# Patient Record
Sex: Female | Born: 1945 | Race: White | Hispanic: No | Marital: Married | Smoking: Never smoker
Health system: Southern US, Community
[De-identification: ages and names within clinical notes are randomized; demographics above are authoritative.]

## PROBLEM LIST (undated history)

## (undated) DIAGNOSIS — J302 Other seasonal allergic rhinitis: Secondary | ICD-10-CM

## (undated) DIAGNOSIS — C439 Malignant melanoma of skin, unspecified: Secondary | ICD-10-CM

## (undated) DIAGNOSIS — E785 Hyperlipidemia, unspecified: Secondary | ICD-10-CM

## (undated) DIAGNOSIS — I10 Essential (primary) hypertension: Secondary | ICD-10-CM

## (undated) HISTORY — DX: Essential (primary) hypertension: I10

## (undated) HISTORY — PX: TUBAL LIGATION: SHX77

## (undated) HISTORY — DX: Hyperlipidemia, unspecified: E78.5

## (undated) HISTORY — PX: GALLBLADDER SURGERY: SHX652

## (undated) HISTORY — PX: ABDOMINAL HYSTERECTOMY: SHX81

## (undated) HISTORY — PX: BREAST BIOPSY: SHX20

## (undated) HISTORY — PX: TONSILLECTOMY: SUR1361

## (undated) HISTORY — DX: Other seasonal allergic rhinitis: J30.2

## (undated) HISTORY — DX: Malignant melanoma of skin, unspecified: C43.9

---

## 2001-01-29 ENCOUNTER — Encounter: Payer: Self-pay | Admitting: Obstetrics and Gynecology

## 2001-01-29 ENCOUNTER — Encounter: Admission: RE | Admit: 2001-01-29 | Discharge: 2001-01-29 | Payer: Self-pay | Admitting: Obstetrics and Gynecology

## 2002-02-10 ENCOUNTER — Encounter: Payer: Self-pay | Admitting: Obstetrics and Gynecology

## 2002-02-10 ENCOUNTER — Encounter: Admission: RE | Admit: 2002-02-10 | Discharge: 2002-02-10 | Payer: Self-pay | Admitting: Obstetrics and Gynecology

## 2002-04-16 ENCOUNTER — Encounter: Payer: Self-pay | Admitting: Family Medicine

## 2002-04-16 ENCOUNTER — Ambulatory Visit (HOSPITAL_COMMUNITY): Admission: RE | Admit: 2002-04-16 | Discharge: 2002-04-16 | Payer: Self-pay | Admitting: Family Medicine

## 2003-02-17 ENCOUNTER — Encounter: Payer: Self-pay | Admitting: Obstetrics and Gynecology

## 2003-02-17 ENCOUNTER — Encounter: Admission: RE | Admit: 2003-02-17 | Discharge: 2003-02-17 | Payer: Self-pay | Admitting: Obstetrics and Gynecology

## 2003-04-15 ENCOUNTER — Other Ambulatory Visit: Admission: RE | Admit: 2003-04-15 | Discharge: 2003-04-15 | Payer: Self-pay | Admitting: Obstetrics and Gynecology

## 2004-02-22 ENCOUNTER — Encounter: Admission: RE | Admit: 2004-02-22 | Discharge: 2004-02-22 | Payer: Self-pay | Admitting: Obstetrics and Gynecology

## 2004-04-24 ENCOUNTER — Other Ambulatory Visit: Admission: RE | Admit: 2004-04-24 | Discharge: 2004-04-24 | Payer: Self-pay | Admitting: Obstetrics and Gynecology

## 2004-09-12 ENCOUNTER — Ambulatory Visit (HOSPITAL_COMMUNITY): Admission: RE | Admit: 2004-09-12 | Discharge: 2004-09-12 | Payer: Self-pay | Admitting: Gastroenterology

## 2005-02-15 ENCOUNTER — Ambulatory Visit (HOSPITAL_COMMUNITY): Admission: RE | Admit: 2005-02-15 | Discharge: 2005-02-15 | Payer: Self-pay

## 2005-03-06 ENCOUNTER — Encounter: Admission: RE | Admit: 2005-03-06 | Discharge: 2005-03-06 | Payer: Self-pay | Admitting: Obstetrics and Gynecology

## 2005-03-07 ENCOUNTER — Encounter: Admission: RE | Admit: 2005-03-07 | Discharge: 2005-03-07 | Payer: Self-pay

## 2005-03-08 ENCOUNTER — Encounter (INDEPENDENT_AMBULATORY_CARE_PROVIDER_SITE_OTHER): Payer: Self-pay | Admitting: *Deleted

## 2005-03-08 ENCOUNTER — Ambulatory Visit (HOSPITAL_COMMUNITY): Admission: RE | Admit: 2005-03-08 | Discharge: 2005-03-08 | Payer: Self-pay

## 2005-03-08 ENCOUNTER — Ambulatory Visit (HOSPITAL_BASED_OUTPATIENT_CLINIC_OR_DEPARTMENT_OTHER): Admission: RE | Admit: 2005-03-08 | Discharge: 2005-03-08 | Payer: Self-pay

## 2005-09-17 ENCOUNTER — Other Ambulatory Visit: Admission: RE | Admit: 2005-09-17 | Discharge: 2005-09-17 | Payer: Self-pay | Admitting: Obstetrics and Gynecology

## 2006-03-18 ENCOUNTER — Encounter: Admission: RE | Admit: 2006-03-18 | Discharge: 2006-03-18 | Payer: Self-pay | Admitting: Obstetrics and Gynecology

## 2007-03-25 ENCOUNTER — Encounter: Admission: RE | Admit: 2007-03-25 | Discharge: 2007-03-25 | Payer: Self-pay | Admitting: Obstetrics and Gynecology

## 2007-09-24 ENCOUNTER — Other Ambulatory Visit: Admission: RE | Admit: 2007-09-24 | Discharge: 2007-09-24 | Payer: Self-pay | Admitting: Obstetrics and Gynecology

## 2008-04-07 ENCOUNTER — Encounter: Admission: RE | Admit: 2008-04-07 | Discharge: 2008-04-07 | Payer: Self-pay | Admitting: Obstetrics and Gynecology

## 2008-04-13 ENCOUNTER — Encounter: Admission: RE | Admit: 2008-04-13 | Discharge: 2008-04-13 | Payer: Self-pay | Admitting: Obstetrics and Gynecology

## 2008-04-14 ENCOUNTER — Encounter: Admission: RE | Admit: 2008-04-14 | Discharge: 2008-04-14 | Payer: Self-pay | Admitting: Obstetrics and Gynecology

## 2008-04-14 ENCOUNTER — Encounter (INDEPENDENT_AMBULATORY_CARE_PROVIDER_SITE_OTHER): Payer: Self-pay | Admitting: Diagnostic Radiology

## 2008-10-12 ENCOUNTER — Encounter: Admission: RE | Admit: 2008-10-12 | Discharge: 2008-10-12 | Payer: Self-pay | Admitting: Obstetrics and Gynecology

## 2008-10-20 ENCOUNTER — Other Ambulatory Visit: Admission: RE | Admit: 2008-10-20 | Discharge: 2008-10-20 | Payer: Self-pay | Admitting: Obstetrics and Gynecology

## 2008-10-20 ENCOUNTER — Ambulatory Visit: Payer: Self-pay | Admitting: Obstetrics and Gynecology

## 2008-10-20 ENCOUNTER — Encounter: Payer: Self-pay | Admitting: Obstetrics and Gynecology

## 2008-10-21 ENCOUNTER — Ambulatory Visit: Payer: Self-pay | Admitting: Obstetrics and Gynecology

## 2009-04-12 ENCOUNTER — Encounter: Admission: RE | Admit: 2009-04-12 | Discharge: 2009-04-12 | Payer: Self-pay | Admitting: Obstetrics and Gynecology

## 2009-10-12 ENCOUNTER — Encounter: Admission: RE | Admit: 2009-10-12 | Discharge: 2009-10-12 | Payer: Self-pay | Admitting: Gastroenterology

## 2009-10-13 ENCOUNTER — Encounter: Admission: RE | Admit: 2009-10-13 | Discharge: 2009-10-13 | Payer: Self-pay | Admitting: Gastroenterology

## 2010-04-19 ENCOUNTER — Encounter: Admission: RE | Admit: 2010-04-19 | Discharge: 2010-04-19 | Payer: Self-pay | Admitting: Obstetrics and Gynecology

## 2011-01-11 NOTE — Op Note (Signed)
NAME:  Ann Cohen, Ann Cohen NO.:  [id number]   MEDICAL RECORD NO.:  [id number]          PATIENT TYPE:  AMB   LOCATION:  DSC                          FACILITY:  [facility]   PHYSICIAN:  Lorre Munroe., M.D.DATE OF BIRTH:  08-27-1945   DATE OF PROCEDURE:  03/08/2005  DATE OF DISCHARGE:                                 OPERATIVE REPORT   PREOPERATIVE DIAGNOSIS:  Malignant melanoma of the back.   POSTOPERATIVE DIAGNOSIS:  Malignant melanoma of the back.   OPERATION:  Wide local excision of melanoma of the back.   SURGEON:  Lebron Conners, M.D.   ANESTHESIA:  General and local.   ESTIMATED BLOOD LOSS:  Minimal.   COMPLICATIONS:  No complications.   PROCEDURE IN DETAIL:  After the patient was monitored and anesthetized and  had routine preparation and draping of the skin of the back at the site of  previously excised melanoma, I marked an elliptical incision going 2 cm on  each side of the lesion at a total length of 12 cm. I made the incision  through the skin at the mark and got hemostasis with cautery. In the area of  the tumor, I dissected straight down through the subcutaneous tissues to the  level of the muscle fascia of the back. I did not excise the fascia. I did  lift the subcutaneous tissues which were quite thick in the region right off  the muscle. I took some subcutaneous tissue from the entire length of the  incision. I marked the specimen with a suture for orientation for the  pathologist's examination. I then undermined the subcutaneous tissues and  the deep plane just superficial to the muscle for several centimeters on  each side. I closed the subcutaneous tissues together with 3-0 Vicryl suture  and that brought the skin much closer together. I mobilized the skin  slightly in the central part of my incision and then I closed the skin with  three sutures of 2-0 nylon putting it  in a mattress fashion, and a running 3-0 nylon suture. I felt I had  excellent closure and an excellent incision and that there was no tension on  the skin line. Sponge, needle, and instrument counts were correct. I  anesthetized the operative area with long-acting local anesthetic. The  patient tolerated the operation well.       WB/MEDQ  D:  03/08/2005  T:  03/08/2005  Job:  [id number]   cc:   Caryn Bee L. Little, M.D.  [address]  [place]  [place] [address]  Fax: [phone]   Elinor Parkinson. Worthy Rancher, M.D.  [address] [place]  [place] [address]  Fax: [phone]

## 2011-01-11 NOTE — Op Note (Signed)
NAME:  Ann Cohen, SCHRODT NO.:  [id number]   MEDICAL RECORD NO.:  [id number]          PATIENT TYPE:  AMB   LOCATION:  ENDO                         FACILITY:  [facility]   PHYSICIAN:  Graylin Shiver, M.D.   DATE OF BIRTH:  06-08-1946   DATE OF PROCEDURE:  09/12/2004  DATE OF DISCHARGE:                                 OPERATIVE REPORT   INDICATIONS:  Screening.   Informed consent was obtained after explanation of risks of bleeding,  infection and perforation.   PREMEDICATION:  Fentanyl 75 mcg IV, Versed 7.5 mg IV.   PROCEDURE:  With the patient in the left lateral decubitus position, a  rectal exam was performed. No masses were felt. The Olympus colonoscope was  inserted into the rectum and advanced around the colon to the cecum. Cecal  landmarks were identified. The cecum and ascending colon were normal. The  transverse colon normal. The descending colon, sigmoid and rectum were  normal. She tolerated the procedure well without complications.   IMPRESSION:  Normal colonoscopy to cecum.       SFG/MEDQ  D:  09/12/2004  T:  09/12/2004  Job:  [id number]   cc:   Caryn Bee L. Little, M.D.  [address]  [place]  [place] [address]  Fax: [phone]

## 2011-03-13 ENCOUNTER — Other Ambulatory Visit: Payer: Self-pay | Admitting: Obstetrics and Gynecology

## 2011-03-13 DIAGNOSIS — Z1231 Encounter for screening mammogram for malignant neoplasm of breast: Secondary | ICD-10-CM

## 2011-04-25 ENCOUNTER — Ambulatory Visit
Admission: RE | Admit: 2011-04-25 | Discharge: 2011-04-25 | Disposition: A | Discharge status: Home / Self Care | Payer: Medicare Other | Source: Ambulatory Visit | Attending: Obstetrics and Gynecology | Admitting: Obstetrics and Gynecology

## 2011-04-25 DIAGNOSIS — Z1231 Encounter for screening mammogram for malignant neoplasm of breast: Secondary | ICD-10-CM

## 2011-09-05 DIAGNOSIS — H40059 Ocular hypertension, unspecified eye: Secondary | ICD-10-CM | POA: Diagnosis not present

## 2011-10-23 DIAGNOSIS — J019 Acute sinusitis, unspecified: Secondary | ICD-10-CM | POA: Diagnosis not present

## 2011-12-18 DIAGNOSIS — M79609 Pain in unspecified limb: Secondary | ICD-10-CM | POA: Diagnosis not present

## 2011-12-19 ENCOUNTER — Other Ambulatory Visit: Payer: Self-pay | Admitting: Family Medicine

## 2011-12-19 ENCOUNTER — Ambulatory Visit
Admission: RE | Admit: 2011-12-19 | Discharge: 2011-12-19 | Disposition: A | Discharge status: Home / Self Care | Payer: Medicare Other | Source: Ambulatory Visit | Attending: Family Medicine | Admitting: Family Medicine

## 2011-12-19 DIAGNOSIS — M7989 Other specified soft tissue disorders: Secondary | ICD-10-CM

## 2011-12-19 DIAGNOSIS — M79669 Pain in unspecified lower leg: Secondary | ICD-10-CM

## 2012-02-20 DIAGNOSIS — D235 Other benign neoplasm of skin of trunk: Secondary | ICD-10-CM | POA: Diagnosis not present

## 2012-02-20 DIAGNOSIS — Z85820 Personal history of malignant melanoma of skin: Secondary | ICD-10-CM | POA: Diagnosis not present

## 2012-02-20 DIAGNOSIS — H61009 Unspecified perichondritis of external ear, unspecified ear: Secondary | ICD-10-CM | POA: Diagnosis not present

## 2012-04-29 DIAGNOSIS — M25579 Pain in unspecified ankle and joints of unspecified foot: Secondary | ICD-10-CM | POA: Diagnosis not present

## 2012-05-11 ENCOUNTER — Other Ambulatory Visit: Payer: Self-pay | Admitting: Obstetrics and Gynecology

## 2012-05-11 DIAGNOSIS — Z1231 Encounter for screening mammogram for malignant neoplasm of breast: Secondary | ICD-10-CM

## 2012-05-25 ENCOUNTER — Ambulatory Visit
Admission: RE | Admit: 2012-05-25 | Discharge: 2012-05-25 | Disposition: A | Discharge status: Home / Self Care | Payer: Managed Care, Other (non HMO) | Source: Ambulatory Visit | Attending: Obstetrics and Gynecology | Admitting: Obstetrics and Gynecology

## 2012-05-25 DIAGNOSIS — Z1231 Encounter for screening mammogram for malignant neoplasm of breast: Secondary | ICD-10-CM | POA: Diagnosis not present

## 2012-10-15 DIAGNOSIS — R05 Cough: Secondary | ICD-10-CM | POA: Diagnosis not present

## 2012-10-15 DIAGNOSIS — R059 Cough, unspecified: Secondary | ICD-10-CM | POA: Diagnosis not present

## 2012-10-15 DIAGNOSIS — I10 Essential (primary) hypertension: Secondary | ICD-10-CM | POA: Diagnosis not present

## 2012-10-15 DIAGNOSIS — R7301 Impaired fasting glucose: Secondary | ICD-10-CM | POA: Diagnosis not present

## 2012-10-15 DIAGNOSIS — Z79899 Other long term (current) drug therapy: Secondary | ICD-10-CM | POA: Diagnosis not present

## 2012-10-15 DIAGNOSIS — Z1331 Encounter for screening for depression: Secondary | ICD-10-CM | POA: Diagnosis not present

## 2012-10-15 DIAGNOSIS — E7800 Pure hypercholesterolemia, unspecified: Secondary | ICD-10-CM | POA: Diagnosis not present

## 2012-10-15 DIAGNOSIS — R42 Dizziness and giddiness: Secondary | ICD-10-CM | POA: Diagnosis not present

## 2012-10-15 DIAGNOSIS — R0789 Other chest pain: Secondary | ICD-10-CM | POA: Diagnosis not present

## 2012-10-15 LAB — PULMONARY FUNCTION TEST

## 2012-11-02 DIAGNOSIS — R0602 Shortness of breath: Secondary | ICD-10-CM | POA: Diagnosis not present

## 2012-11-02 DIAGNOSIS — I10 Essential (primary) hypertension: Secondary | ICD-10-CM | POA: Diagnosis not present

## 2012-11-02 DIAGNOSIS — R011 Cardiac murmur, unspecified: Secondary | ICD-10-CM | POA: Diagnosis not present

## 2012-11-02 DIAGNOSIS — R079 Chest pain, unspecified: Secondary | ICD-10-CM | POA: Diagnosis not present

## 2012-11-11 DIAGNOSIS — R0602 Shortness of breath: Secondary | ICD-10-CM | POA: Diagnosis not present

## 2012-11-11 DIAGNOSIS — R079 Chest pain, unspecified: Secondary | ICD-10-CM | POA: Diagnosis not present

## 2012-11-11 DIAGNOSIS — I10 Essential (primary) hypertension: Secondary | ICD-10-CM | POA: Diagnosis not present

## 2012-12-09 ENCOUNTER — Encounter: Payer: Self-pay | Admitting: Pulmonary Disease

## 2012-12-09 ENCOUNTER — Ambulatory Visit (INDEPENDENT_AMBULATORY_CARE_PROVIDER_SITE_OTHER): Payer: Medicare Other | Admitting: Pulmonary Disease

## 2012-12-09 VITALS — BP 136/80 | HR 69 | Temp 98.3°F | Ht 60.0 in | Wt 176.2 lb

## 2012-12-09 DIAGNOSIS — R059 Cough, unspecified: Secondary | ICD-10-CM | POA: Diagnosis not present

## 2012-12-09 DIAGNOSIS — R05 Cough: Secondary | ICD-10-CM

## 2012-12-09 DIAGNOSIS — R053 Chronic cough: Secondary | ICD-10-CM | POA: Insufficient documentation

## 2012-12-09 MED ORDER — FLUTICASONE PROPIONATE 50 MCG/ACT NA SUSP: 2.0000 | Freq: Every day | NASAL | Status: DC

## 2012-12-09 MED ORDER — TRAMADOL HCL 50 MG PO TABS: 50.0000 mg | ORAL_TABLET | Freq: Four times a day (QID) | ORAL | Status: DC | PRN

## 2012-12-09 NOTE — Progress Notes (Signed)
  Subjective:    Patient ID: Ann Cohen, female    DOB: July 18, 1946, 67 y.o.   MRN: [id number]  HPI The patient is a 67 year old female who I've been asked to see for chronic cough.  The patient states that she has had a cough for 3 years duration, and it typically is intermittent.  When it does occur, it stays for quite a long time.  Her cough is dry and hacking in nature, and she denies any chest congestion.  She is unsure if she has persistent throat clearing, but she is doing this quite a bit during the visit.  She feels the stimulus for her cough is coming from her left upper lungs.  She knows of nothing which makes her cough better, and feels that it is worse during allergy season.  The cough is also worse with prolonged conversation.  The patient has a history of peptic ulcer disease, and has ongoing symptoms of reflux.  She is currently on a proton pump inhibitor in bearing doses, as well as Pepcid.  She denies any postnasal drip, nasal congestion, or recurrent sinus infections.  However, she stays on her allergy medications regularly.  She feels her exertional tolerance is worse than one to 2 years ago, and also states that her weight has been neutral.  She has no history of asthma, and has never smoked.  She has had a chest x-ray and spirometry recently, but that data is not available at this time.   Review of Systems  Constitutional: Negative for fever and unexpected weight change.  HENT: Negative for ear pain, nosebleeds, congestion, sore throat, rhinorrhea, sneezing, trouble swallowing, dental problem, postnasal drip and sinus pressure.   Eyes: Negative for redness and itching.  Respiratory: Positive for cough and shortness of breath. Negative for chest tightness and wheezing.   Cardiovascular: Negative for palpitations and leg swelling.  Gastrointestinal: Negative for nausea and vomiting.  Genitourinary: Negative for dysuria.  Musculoskeletal: Negative for joint swelling.  Skin:  Negative for rash.  Neurological: Negative for headaches.  Hematological: Does not bruise/bleed easily.  Psychiatric/Behavioral: Negative for dysphoric mood. The patient is not nervous/anxious.        Objective:   Physical Exam Constitutional:  Well developed, no acute distress  HENT:  Nares patent without discharge, mild inflammatory changes in nasal mucosa  Oropharynx without exudate, palate and uvula are normal  Eyes:  Perrla, eomi, no scleral icterus  Neck:  No JVD, no TMG  Cardiovascular:  Normal rate, regular rhythm, no rubs or gallops.  No murmurs        Intact distal pulses  Pulmonary :  Normal breath sounds, no stridor or respiratory distress   No rales, rhonchi, or wheezing  Abdominal:  Soft, nondistended, bowel sounds present.  No tenderness noted.   Musculoskeletal:  No lower extremity edema noted.  Lymph Nodes:  No cervical lymphadenopathy noted  Skin:  No cyanosis noted  Neurologic:  Alert, appropriate, moves all 4 extremities without obvious deficit.         Assessment & Plan:

## 2012-12-09 NOTE — Patient Instructions (Addendum)
Take your nexium one in am and pm everyday, and take pepcid at bedtime Start on your nasal spray, 2 each nostril each am for next 2 weeks. Stop allegra, and start chlorpheniramine 4mg .  Take 2 at bedtime and one at lunch for next 2 weeks. No throat clearing, minimize voice use.  Keep hard candy (no menthol or mint) in your mouth throughout the day to help prevent cough Can take tramadol 50mg  one every 6hrs if needed for cough.  Will get cxr report and your breathing tests from Dr. Clarene Duke to review. followup with me in 2 weeks.

## 2012-12-09 NOTE — Addendum Note (Signed)
Addended by: Nita Sells on: 12/09/2012 04:38 PM   Modules accepted: Orders

## 2012-12-09 NOTE — Assessment & Plan Note (Signed)
The patient has a chronic cough of 3 years duration that I suspect his upper airway in origin.  She has had spirometry and chest x-ray better not available, and we'll certainly review these.  She has a long history of reflux, and feels that she has breakthrough symptoms despite taking acid suppression medications.  I suspect she also has a component of cyclical coughing.  Her lungs are totally clear today, and I suspect this is not related to a pulmonary process.  However, we must keep in mind cough variant asthma, as well as eosinophilic bronchitis.  Will work on aggressive treatment for postnasal drip, reflux disease, and also cyclical coughing, and see how she does.

## 2012-12-10 ENCOUNTER — Telehealth: Payer: Self-pay | Admitting: Pulmonary Disease

## 2012-12-10 NOTE — Telephone Encounter (Signed)
One thing is to take on a full stomach. She could try a half and see if that works for her.  If still not tolerating, will have to discontinue and keep doing other recommendations.

## 2012-12-10 NOTE — Telephone Encounter (Signed)
I spoke with pt. She stated she took the tramadol yesterday for the 1st time. She states she was not able to sleep "a wink last night" and feels sick to ehr stomach this morning. She wants to know if she cuts in half if [facility] thinks she will have the same affect or what are other recs.Please advise thanks  Allergies  Allergen Reactions  . Codeine   . Sulfur

## 2012-12-10 NOTE — Telephone Encounter (Signed)
Pt is aware of [facility] recommendations.

## 2012-12-18 ENCOUNTER — Institutional Professional Consult (permissible substitution): Payer: Medicare Other | Admitting: Pulmonary Disease

## 2012-12-23 ENCOUNTER — Encounter: Payer: Self-pay | Admitting: Pulmonary Disease

## 2012-12-23 ENCOUNTER — Ambulatory Visit (INDEPENDENT_AMBULATORY_CARE_PROVIDER_SITE_OTHER): Payer: Medicare Other | Admitting: Pulmonary Disease

## 2012-12-23 VITALS — BP 152/94 | HR 74 | Temp 97.9°F | Ht 60.0 in | Wt 175.0 lb

## 2012-12-23 DIAGNOSIS — R053 Chronic cough: Secondary | ICD-10-CM

## 2012-12-23 DIAGNOSIS — R05 Cough: Secondary | ICD-10-CM | POA: Diagnosis not present

## 2012-12-23 DIAGNOSIS — R059 Cough, unspecified: Secondary | ICD-10-CM | POA: Diagnosis not present

## 2012-12-23 NOTE — Progress Notes (Signed)
  Subjective:    Patient ID: Ann Cohen, female    DOB: [birth year], 67 y.o.   MRN: [id number]  HPI The patient comes in today for followup of her chronic cough.  At last visit, she was treated more aggressively for postnasal drip, reflux disease, and cyclical coughing.  She returns today where her cough is rated a one, and it was a 10 at last visit.  She has had a very good response in a short period of time, and is pleased with her progress.  She is unsure what has helped her the most regarding resolution.   Review of Systems  Constitutional: Negative for fever and unexpected weight change.  HENT: Negative for ear pain, nosebleeds, congestion, sore throat, rhinorrhea, sneezing, trouble swallowing, dental problem, postnasal drip and sinus pressure.   Eyes: Negative for redness and itching.  Respiratory: Negative for cough, chest tightness, shortness of breath and wheezing.   Cardiovascular: Negative for palpitations and leg swelling.  Gastrointestinal: Negative for nausea and vomiting.  Genitourinary: Negative for dysuria.  Musculoskeletal: Negative for joint swelling.  Skin: Negative for rash.  Neurological: Negative for headaches.  Hematological: Does not bruise/bleed easily.  Psychiatric/Behavioral: Negative for dysphoric mood. The patient is not nervous/anxious.        Objective:   Physical Exam Obese female in no acute distress Nose without purulence or discharge noted Neck without lymphadenopathy or thyromegaly Lower extremities without edema, no cyanosis Alert and oriented, moves all 4 extremities.       Assessment & Plan:

## 2012-12-23 NOTE — Assessment & Plan Note (Signed)
The patient is much improved with aggressive treatment of postnasal drip, reflux disease, and cyclical coughing.  I would like to back off on some of the medications to more reasonable doses, and see if she maintains.  I suspect reflux is more of an issue here than the patient suspects.  If her cough begins to return, I would increase her proton pump inhibitor back to b.i.d. Dosing.

## 2012-12-23 NOTE — Patient Instructions (Addendum)
Stop pepcid at bedtime and decrease nexium back to each am only.  If your cough begins to return, increase your nexium back to am and pm. Stay on flonase, but change your chlorpheniramine to as needed (rather than everyday).  Continue with hard candy as needed, no throat clearing, limiting voice use. Please call if you cough is returning.

## 2013-02-11 DIAGNOSIS — E7800 Pure hypercholesterolemia, unspecified: Secondary | ICD-10-CM | POA: Diagnosis not present

## 2013-03-09 DIAGNOSIS — D235 Other benign neoplasm of skin of trunk: Secondary | ICD-10-CM | POA: Diagnosis not present

## 2013-03-09 DIAGNOSIS — Z85820 Personal history of malignant melanoma of skin: Secondary | ICD-10-CM | POA: Diagnosis not present

## 2013-03-09 DIAGNOSIS — L570 Actinic keratosis: Secondary | ICD-10-CM | POA: Diagnosis not present

## 2013-05-03 ENCOUNTER — Other Ambulatory Visit: Payer: Self-pay

## 2013-05-03 DIAGNOSIS — Z1231 Encounter for screening mammogram for malignant neoplasm of breast: Secondary | ICD-10-CM

## 2013-05-10 DIAGNOSIS — M545 Low back pain, unspecified: Secondary | ICD-10-CM | POA: Diagnosis not present

## 2013-05-21 DIAGNOSIS — R7301 Impaired fasting glucose: Secondary | ICD-10-CM | POA: Diagnosis not present

## 2013-05-21 DIAGNOSIS — R0789 Other chest pain: Secondary | ICD-10-CM | POA: Diagnosis not present

## 2013-05-21 DIAGNOSIS — Z79899 Other long term (current) drug therapy: Secondary | ICD-10-CM | POA: Diagnosis not present

## 2013-05-21 DIAGNOSIS — M25569 Pain in unspecified knee: Secondary | ICD-10-CM | POA: Diagnosis not present

## 2013-05-21 DIAGNOSIS — R42 Dizziness and giddiness: Secondary | ICD-10-CM | POA: Diagnosis not present

## 2013-05-21 DIAGNOSIS — S32009A Unspecified fracture of unspecified lumbar vertebra, initial encounter for closed fracture: Secondary | ICD-10-CM | POA: Diagnosis not present

## 2013-05-21 DIAGNOSIS — I10 Essential (primary) hypertension: Secondary | ICD-10-CM | POA: Diagnosis not present

## 2013-05-21 DIAGNOSIS — R05 Cough: Secondary | ICD-10-CM | POA: Diagnosis not present

## 2013-05-21 DIAGNOSIS — Z1331 Encounter for screening for depression: Secondary | ICD-10-CM | POA: Diagnosis not present

## 2013-05-21 DIAGNOSIS — R059 Cough, unspecified: Secondary | ICD-10-CM | POA: Diagnosis not present

## 2013-05-21 DIAGNOSIS — E7800 Pure hypercholesterolemia, unspecified: Secondary | ICD-10-CM | POA: Diagnosis not present

## 2013-06-01 ENCOUNTER — Ambulatory Visit: Payer: BLUE CROSS/BLUE SHIELD

## 2013-06-11 DIAGNOSIS — M1710 Unilateral primary osteoarthritis, unspecified knee: Secondary | ICD-10-CM | POA: Diagnosis not present

## 2013-06-11 DIAGNOSIS — M545 Low back pain, unspecified: Secondary | ICD-10-CM | POA: Diagnosis not present

## 2013-06-22 ENCOUNTER — Ambulatory Visit
Admission: RE | Admit: 2013-06-22 | Discharge: 2013-06-22 | Disposition: A | Discharge status: Home / Self Care | Payer: Medicare Other | Source: Ambulatory Visit

## 2013-06-22 DIAGNOSIS — Z1231 Encounter for screening mammogram for malignant neoplasm of breast: Secondary | ICD-10-CM | POA: Diagnosis not present

## 2013-06-22 DIAGNOSIS — Z23 Encounter for immunization: Secondary | ICD-10-CM | POA: Diagnosis not present

## 2013-07-09 DIAGNOSIS — M1710 Unilateral primary osteoarthritis, unspecified knee: Secondary | ICD-10-CM | POA: Diagnosis not present

## 2013-08-09 DIAGNOSIS — M545 Low back pain, unspecified: Secondary | ICD-10-CM | POA: Diagnosis not present

## 2013-08-09 DIAGNOSIS — S22009B Unspecified fracture of unspecified thoracic vertebra, initial encounter for open fracture: Secondary | ICD-10-CM | POA: Diagnosis not present

## 2013-08-11 DIAGNOSIS — S22009B Unspecified fracture of unspecified thoracic vertebra, initial encounter for open fracture: Secondary | ICD-10-CM | POA: Diagnosis not present

## 2013-08-11 DIAGNOSIS — M545 Low back pain, unspecified: Secondary | ICD-10-CM | POA: Diagnosis not present

## 2013-08-16 DIAGNOSIS — M545 Low back pain, unspecified: Secondary | ICD-10-CM | POA: Diagnosis not present

## 2013-08-16 DIAGNOSIS — S22009B Unspecified fracture of unspecified thoracic vertebra, initial encounter for open fracture: Secondary | ICD-10-CM | POA: Diagnosis not present

## 2013-08-23 DIAGNOSIS — M545 Low back pain, unspecified: Secondary | ICD-10-CM | POA: Diagnosis not present

## 2013-08-23 DIAGNOSIS — S22009B Unspecified fracture of unspecified thoracic vertebra, initial encounter for open fracture: Secondary | ICD-10-CM | POA: Diagnosis not present

## 2013-08-30 DIAGNOSIS — M545 Low back pain, unspecified: Secondary | ICD-10-CM | POA: Diagnosis not present

## 2013-08-30 DIAGNOSIS — S22009B Unspecified fracture of unspecified thoracic vertebra, initial encounter for open fracture: Secondary | ICD-10-CM | POA: Diagnosis not present

## 2013-09-06 DIAGNOSIS — M545 Low back pain, unspecified: Secondary | ICD-10-CM | POA: Diagnosis not present

## 2013-09-06 DIAGNOSIS — S22009B Unspecified fracture of unspecified thoracic vertebra, initial encounter for open fracture: Secondary | ICD-10-CM | POA: Diagnosis not present

## 2013-09-14 DIAGNOSIS — S93609A Unspecified sprain of unspecified foot, initial encounter: Secondary | ICD-10-CM | POA: Diagnosis not present

## 2013-10-07 DIAGNOSIS — M949 Disorder of cartilage, unspecified: Secondary | ICD-10-CM | POA: Diagnosis not present

## 2013-10-07 DIAGNOSIS — M899 Disorder of bone, unspecified: Secondary | ICD-10-CM | POA: Diagnosis not present

## 2013-10-07 DIAGNOSIS — I10 Essential (primary) hypertension: Secondary | ICD-10-CM | POA: Diagnosis not present

## 2013-10-07 DIAGNOSIS — E7800 Pure hypercholesterolemia, unspecified: Secondary | ICD-10-CM | POA: Diagnosis not present

## 2013-10-07 DIAGNOSIS — Z23 Encounter for immunization: Secondary | ICD-10-CM | POA: Diagnosis not present

## 2013-10-07 DIAGNOSIS — N189 Chronic kidney disease, unspecified: Secondary | ICD-10-CM | POA: Diagnosis not present

## 2013-11-02 DIAGNOSIS — M949 Disorder of cartilage, unspecified: Secondary | ICD-10-CM | POA: Diagnosis not present

## 2013-11-02 DIAGNOSIS — M899 Disorder of bone, unspecified: Secondary | ICD-10-CM | POA: Diagnosis not present

## 2014-03-03 ENCOUNTER — Encounter: Payer: Self-pay | Admitting: Cardiology

## 2014-03-03 DIAGNOSIS — C439 Malignant melanoma of skin, unspecified: Secondary | ICD-10-CM | POA: Insufficient documentation

## 2014-03-03 DIAGNOSIS — I10 Essential (primary) hypertension: Secondary | ICD-10-CM | POA: Insufficient documentation

## 2014-03-03 DIAGNOSIS — E785 Hyperlipidemia, unspecified: Secondary | ICD-10-CM | POA: Insufficient documentation

## 2014-03-04 DIAGNOSIS — M25559 Pain in unspecified hip: Secondary | ICD-10-CM | POA: Diagnosis not present

## 2014-03-15 DIAGNOSIS — L723 Sebaceous cyst: Secondary | ICD-10-CM | POA: Diagnosis not present

## 2014-03-15 DIAGNOSIS — Z85820 Personal history of malignant melanoma of skin: Secondary | ICD-10-CM | POA: Diagnosis not present

## 2014-03-15 DIAGNOSIS — D235 Other benign neoplasm of skin of trunk: Secondary | ICD-10-CM | POA: Diagnosis not present

## 2014-03-15 DIAGNOSIS — L819 Disorder of pigmentation, unspecified: Secondary | ICD-10-CM | POA: Diagnosis not present

## 2014-04-20 DIAGNOSIS — R7301 Impaired fasting glucose: Secondary | ICD-10-CM | POA: Diagnosis not present

## 2014-04-20 DIAGNOSIS — E559 Vitamin D deficiency, unspecified: Secondary | ICD-10-CM | POA: Diagnosis not present

## 2014-05-23 DIAGNOSIS — H2510 Age-related nuclear cataract, unspecified eye: Secondary | ICD-10-CM | POA: Diagnosis not present

## 2014-05-25 ENCOUNTER — Other Ambulatory Visit: Payer: Self-pay

## 2014-05-25 DIAGNOSIS — R21 Rash and other nonspecific skin eruption: Secondary | ICD-10-CM | POA: Diagnosis not present

## 2014-05-25 DIAGNOSIS — Z1231 Encounter for screening mammogram for malignant neoplasm of breast: Secondary | ICD-10-CM

## 2014-06-30 ENCOUNTER — Ambulatory Visit
Admission: RE | Admit: 2014-06-30 | Discharge: 2014-06-30 | Disposition: A | Discharge status: Home / Self Care | Payer: Medicare Other | Source: Ambulatory Visit

## 2014-06-30 DIAGNOSIS — Z1231 Encounter for screening mammogram for malignant neoplasm of breast: Secondary | ICD-10-CM | POA: Diagnosis not present

## 2014-06-30 DIAGNOSIS — Z79899 Other long term (current) drug therapy: Secondary | ICD-10-CM | POA: Diagnosis not present

## 2014-06-30 DIAGNOSIS — E782 Mixed hyperlipidemia: Secondary | ICD-10-CM | POA: Diagnosis not present

## 2014-06-30 DIAGNOSIS — Z23 Encounter for immunization: Secondary | ICD-10-CM | POA: Diagnosis not present

## 2014-08-03 DIAGNOSIS — M79675 Pain in left toe(s): Secondary | ICD-10-CM | POA: Diagnosis not present

## 2014-08-10 DIAGNOSIS — M79675 Pain in left toe(s): Secondary | ICD-10-CM | POA: Diagnosis not present

## 2014-08-11 DIAGNOSIS — J069 Acute upper respiratory infection, unspecified: Secondary | ICD-10-CM | POA: Diagnosis not present

## 2014-09-28 DIAGNOSIS — E782 Mixed hyperlipidemia: Secondary | ICD-10-CM | POA: Diagnosis not present

## 2014-09-28 DIAGNOSIS — Z23 Encounter for immunization: Secondary | ICD-10-CM | POA: Diagnosis not present

## 2014-09-28 DIAGNOSIS — R7301 Impaired fasting glucose: Secondary | ICD-10-CM | POA: Diagnosis not present

## 2014-09-28 DIAGNOSIS — N189 Chronic kidney disease, unspecified: Secondary | ICD-10-CM | POA: Diagnosis not present

## 2014-09-28 DIAGNOSIS — I10 Essential (primary) hypertension: Secondary | ICD-10-CM | POA: Diagnosis not present

## 2014-09-28 DIAGNOSIS — M8580 Other specified disorders of bone density and structure, unspecified site: Secondary | ICD-10-CM | POA: Diagnosis not present

## 2014-12-27 ENCOUNTER — Other Ambulatory Visit (HOSPITAL_COMMUNITY): Payer: Self-pay | Admitting: Gastroenterology

## 2014-12-27 DIAGNOSIS — D123 Benign neoplasm of transverse colon: Secondary | ICD-10-CM | POA: Diagnosis not present

## 2014-12-27 DIAGNOSIS — K6389 Other specified diseases of intestine: Secondary | ICD-10-CM | POA: Diagnosis not present

## 2014-12-27 DIAGNOSIS — Z86010 Personal history of colonic polyps: Secondary | ICD-10-CM | POA: Diagnosis not present

## 2014-12-27 DIAGNOSIS — K635 Polyp of colon: Secondary | ICD-10-CM | POA: Diagnosis not present

## 2014-12-27 DIAGNOSIS — D120 Benign neoplasm of cecum: Secondary | ICD-10-CM | POA: Diagnosis not present

## 2014-12-27 DIAGNOSIS — Z09 Encounter for follow-up examination after completed treatment for conditions other than malignant neoplasm: Secondary | ICD-10-CM | POA: Diagnosis not present

## 2015-04-25 DIAGNOSIS — L570 Actinic keratosis: Secondary | ICD-10-CM | POA: Diagnosis not present

## 2015-04-25 DIAGNOSIS — Z08 Encounter for follow-up examination after completed treatment for malignant neoplasm: Secondary | ICD-10-CM | POA: Diagnosis not present

## 2015-04-25 DIAGNOSIS — L821 Other seborrheic keratosis: Secondary | ICD-10-CM | POA: Diagnosis not present

## 2015-04-25 DIAGNOSIS — Z85820 Personal history of malignant melanoma of skin: Secondary | ICD-10-CM | POA: Diagnosis not present

## 2015-04-25 DIAGNOSIS — D225 Melanocytic nevi of trunk: Secondary | ICD-10-CM | POA: Diagnosis not present

## 2015-05-26 DIAGNOSIS — Z23 Encounter for immunization: Secondary | ICD-10-CM | POA: Diagnosis not present

## 2015-05-31 ENCOUNTER — Other Ambulatory Visit: Payer: Self-pay

## 2015-05-31 DIAGNOSIS — Z1231 Encounter for screening mammogram for malignant neoplasm of breast: Secondary | ICD-10-CM

## 2015-06-08 ENCOUNTER — Other Ambulatory Visit: Payer: Self-pay | Admitting: Dermatology

## 2015-06-08 DIAGNOSIS — Z08 Encounter for follow-up examination after completed treatment for malignant neoplasm: Secondary | ICD-10-CM | POA: Diagnosis not present

## 2015-06-08 DIAGNOSIS — C44622 Squamous cell carcinoma of skin of right upper limb, including shoulder: Secondary | ICD-10-CM | POA: Diagnosis not present

## 2015-06-08 DIAGNOSIS — L570 Actinic keratosis: Secondary | ICD-10-CM | POA: Diagnosis not present

## 2015-06-08 DIAGNOSIS — Z85820 Personal history of malignant melanoma of skin: Secondary | ICD-10-CM | POA: Diagnosis not present

## 2015-06-29 DIAGNOSIS — H02834 Dermatochalasis of left upper eyelid: Secondary | ICD-10-CM | POA: Diagnosis not present

## 2015-06-29 DIAGNOSIS — H02831 Dermatochalasis of right upper eyelid: Secondary | ICD-10-CM | POA: Diagnosis not present

## 2015-06-29 DIAGNOSIS — H2513 Age-related nuclear cataract, bilateral: Secondary | ICD-10-CM | POA: Diagnosis not present

## 2015-06-29 DIAGNOSIS — H5203 Hypermetropia, bilateral: Secondary | ICD-10-CM | POA: Diagnosis not present

## 2015-07-06 ENCOUNTER — Ambulatory Visit
Admission: RE | Admit: 2015-07-06 | Discharge: 2015-07-06 | Disposition: A | Discharge status: Home / Self Care | Payer: Medicare Other | Source: Ambulatory Visit

## 2015-07-06 DIAGNOSIS — Z1231 Encounter for screening mammogram for malignant neoplasm of breast: Secondary | ICD-10-CM

## 2015-07-09 DIAGNOSIS — L710 Perioral dermatitis: Secondary | ICD-10-CM | POA: Diagnosis not present

## 2015-08-10 DIAGNOSIS — L249 Irritant contact dermatitis, unspecified cause: Secondary | ICD-10-CM | POA: Diagnosis not present

## 2015-08-15 DIAGNOSIS — R05 Cough: Secondary | ICD-10-CM | POA: Diagnosis not present

## 2015-08-23 DIAGNOSIS — R8299 Other abnormal findings in urine: Secondary | ICD-10-CM | POA: Diagnosis not present

## 2015-09-06 DIAGNOSIS — R1030 Lower abdominal pain, unspecified: Secondary | ICD-10-CM | POA: Diagnosis not present

## 2015-09-06 DIAGNOSIS — K5900 Constipation, unspecified: Secondary | ICD-10-CM | POA: Diagnosis not present

## 2015-09-06 DIAGNOSIS — R319 Hematuria, unspecified: Secondary | ICD-10-CM | POA: Diagnosis not present

## 2015-09-06 DIAGNOSIS — R109 Unspecified abdominal pain: Secondary | ICD-10-CM | POA: Diagnosis not present

## 2015-09-07 DIAGNOSIS — R3121 Asymptomatic microscopic hematuria: Secondary | ICD-10-CM | POA: Diagnosis not present

## 2015-09-07 DIAGNOSIS — N281 Cyst of kidney, acquired: Secondary | ICD-10-CM | POA: Diagnosis not present

## 2015-09-07 DIAGNOSIS — R1084 Generalized abdominal pain: Secondary | ICD-10-CM | POA: Diagnosis not present

## 2015-09-07 DIAGNOSIS — Z0000 Encounter for general adult medical examination without abnormal findings: Secondary | ICD-10-CM | POA: Diagnosis not present

## 2015-09-07 DIAGNOSIS — R1030 Lower abdominal pain, unspecified: Secondary | ICD-10-CM | POA: Diagnosis not present

## 2015-09-26 DIAGNOSIS — M25512 Pain in left shoulder: Secondary | ICD-10-CM | POA: Diagnosis not present

## 2015-09-28 DIAGNOSIS — Z85820 Personal history of malignant melanoma of skin: Secondary | ICD-10-CM | POA: Diagnosis not present

## 2015-09-28 DIAGNOSIS — M899 Disorder of bone, unspecified: Secondary | ICD-10-CM | POA: Diagnosis not present

## 2015-09-28 DIAGNOSIS — I10 Essential (primary) hypertension: Secondary | ICD-10-CM | POA: Diagnosis not present

## 2015-09-28 DIAGNOSIS — R7301 Impaired fasting glucose: Secondary | ICD-10-CM | POA: Diagnosis not present

## 2015-09-28 DIAGNOSIS — M8580 Other specified disorders of bone density and structure, unspecified site: Secondary | ICD-10-CM | POA: Diagnosis not present

## 2015-09-28 DIAGNOSIS — N189 Chronic kidney disease, unspecified: Secondary | ICD-10-CM | POA: Diagnosis not present

## 2015-09-29 DIAGNOSIS — M25512 Pain in left shoulder: Secondary | ICD-10-CM | POA: Diagnosis not present

## 2015-10-02 DIAGNOSIS — M25512 Pain in left shoulder: Secondary | ICD-10-CM | POA: Diagnosis not present

## 2015-10-05 DIAGNOSIS — M25512 Pain in left shoulder: Secondary | ICD-10-CM | POA: Diagnosis not present

## 2015-10-09 DIAGNOSIS — M25512 Pain in left shoulder: Secondary | ICD-10-CM | POA: Diagnosis not present

## 2015-10-12 DIAGNOSIS — M25512 Pain in left shoulder: Secondary | ICD-10-CM | POA: Diagnosis not present

## 2015-10-16 DIAGNOSIS — M25512 Pain in left shoulder: Secondary | ICD-10-CM | POA: Diagnosis not present

## 2015-10-19 DIAGNOSIS — M25512 Pain in left shoulder: Secondary | ICD-10-CM | POA: Diagnosis not present

## 2015-10-24 DIAGNOSIS — M25512 Pain in left shoulder: Secondary | ICD-10-CM | POA: Diagnosis not present

## 2015-10-25 DIAGNOSIS — M25512 Pain in left shoulder: Secondary | ICD-10-CM | POA: Diagnosis not present

## 2015-11-02 DIAGNOSIS — M25512 Pain in left shoulder: Secondary | ICD-10-CM | POA: Diagnosis not present

## 2015-11-02 DIAGNOSIS — E782 Mixed hyperlipidemia: Secondary | ICD-10-CM | POA: Diagnosis not present

## 2015-11-02 DIAGNOSIS — M8580 Other specified disorders of bone density and structure, unspecified site: Secondary | ICD-10-CM | POA: Diagnosis not present

## 2015-11-02 DIAGNOSIS — E559 Vitamin D deficiency, unspecified: Secondary | ICD-10-CM | POA: Diagnosis not present

## 2015-11-02 DIAGNOSIS — Z85820 Personal history of malignant melanoma of skin: Secondary | ICD-10-CM | POA: Diagnosis not present

## 2015-11-02 DIAGNOSIS — I10 Essential (primary) hypertension: Secondary | ICD-10-CM | POA: Diagnosis not present

## 2015-11-02 DIAGNOSIS — R7301 Impaired fasting glucose: Secondary | ICD-10-CM | POA: Diagnosis not present

## 2015-11-02 DIAGNOSIS — N189 Chronic kidney disease, unspecified: Secondary | ICD-10-CM | POA: Diagnosis not present

## 2015-11-02 DIAGNOSIS — M899 Disorder of bone, unspecified: Secondary | ICD-10-CM | POA: Diagnosis not present

## 2015-11-06 DIAGNOSIS — M25512 Pain in left shoulder: Secondary | ICD-10-CM | POA: Diagnosis not present

## 2015-12-13 DIAGNOSIS — M24812 Other specific joint derangements of left shoulder, not elsewhere classified: Secondary | ICD-10-CM | POA: Diagnosis not present

## 2015-12-13 DIAGNOSIS — M67912 Unspecified disorder of synovium and tendon, left shoulder: Secondary | ICD-10-CM | POA: Diagnosis not present

## 2015-12-19 DIAGNOSIS — M7542 Impingement syndrome of left shoulder: Secondary | ICD-10-CM | POA: Diagnosis not present

## 2015-12-19 DIAGNOSIS — M19012 Primary osteoarthritis, left shoulder: Secondary | ICD-10-CM | POA: Diagnosis not present

## 2015-12-19 DIAGNOSIS — M24112 Other articular cartilage disorders, left shoulder: Secondary | ICD-10-CM | POA: Diagnosis not present

## 2015-12-19 DIAGNOSIS — M75122 Complete rotator cuff tear or rupture of left shoulder, not specified as traumatic: Secondary | ICD-10-CM | POA: Diagnosis not present

## 2015-12-19 DIAGNOSIS — M75112 Incomplete rotator cuff tear or rupture of left shoulder, not specified as traumatic: Secondary | ICD-10-CM | POA: Diagnosis not present

## 2015-12-19 DIAGNOSIS — G8918 Other acute postprocedural pain: Secondary | ICD-10-CM | POA: Diagnosis not present

## 2015-12-27 DIAGNOSIS — M19012 Primary osteoarthritis, left shoulder: Secondary | ICD-10-CM | POA: Diagnosis not present

## 2016-01-05 DIAGNOSIS — M25512 Pain in left shoulder: Secondary | ICD-10-CM | POA: Diagnosis not present

## 2016-01-05 DIAGNOSIS — R278 Other lack of coordination: Secondary | ICD-10-CM | POA: Diagnosis not present

## 2016-01-08 DIAGNOSIS — R278 Other lack of coordination: Secondary | ICD-10-CM | POA: Diagnosis not present

## 2016-01-08 DIAGNOSIS — M25512 Pain in left shoulder: Secondary | ICD-10-CM | POA: Diagnosis not present

## 2016-01-10 DIAGNOSIS — R278 Other lack of coordination: Secondary | ICD-10-CM | POA: Diagnosis not present

## 2016-01-10 DIAGNOSIS — M25512 Pain in left shoulder: Secondary | ICD-10-CM | POA: Diagnosis not present

## 2016-01-12 DIAGNOSIS — M19012 Primary osteoarthritis, left shoulder: Secondary | ICD-10-CM | POA: Diagnosis not present

## 2016-01-12 DIAGNOSIS — M25512 Pain in left shoulder: Secondary | ICD-10-CM | POA: Diagnosis not present

## 2016-01-12 DIAGNOSIS — R278 Other lack of coordination: Secondary | ICD-10-CM | POA: Diagnosis not present

## 2016-02-07 DIAGNOSIS — Z9889 Other specified postprocedural states: Secondary | ICD-10-CM | POA: Diagnosis not present

## 2016-05-02 DIAGNOSIS — D1801 Hemangioma of skin and subcutaneous tissue: Secondary | ICD-10-CM | POA: Diagnosis not present

## 2016-05-02 DIAGNOSIS — L821 Other seborrheic keratosis: Secondary | ICD-10-CM | POA: Diagnosis not present

## 2016-05-02 DIAGNOSIS — C4401 Basal cell carcinoma of skin of lip: Secondary | ICD-10-CM | POA: Diagnosis not present

## 2016-05-02 DIAGNOSIS — C44319 Basal cell carcinoma of skin of other parts of face: Secondary | ICD-10-CM | POA: Diagnosis not present

## 2016-05-02 DIAGNOSIS — D225 Melanocytic nevi of trunk: Secondary | ICD-10-CM | POA: Diagnosis not present

## 2016-05-02 DIAGNOSIS — L812 Freckles: Secondary | ICD-10-CM | POA: Diagnosis not present

## 2016-05-02 DIAGNOSIS — Z85820 Personal history of malignant melanoma of skin: Secondary | ICD-10-CM | POA: Diagnosis not present

## 2016-05-05 DIAGNOSIS — Z23 Encounter for immunization: Secondary | ICD-10-CM | POA: Diagnosis not present

## 2016-06-04 ENCOUNTER — Other Ambulatory Visit: Payer: Self-pay | Admitting: Family Medicine

## 2016-06-04 DIAGNOSIS — Z1231 Encounter for screening mammogram for malignant neoplasm of breast: Secondary | ICD-10-CM

## 2016-06-27 DIAGNOSIS — H02831 Dermatochalasis of right upper eyelid: Secondary | ICD-10-CM | POA: Diagnosis not present

## 2016-06-27 DIAGNOSIS — H2513 Age-related nuclear cataract, bilateral: Secondary | ICD-10-CM | POA: Diagnosis not present

## 2016-06-27 DIAGNOSIS — H5203 Hypermetropia, bilateral: Secondary | ICD-10-CM | POA: Diagnosis not present

## 2016-06-27 DIAGNOSIS — H02834 Dermatochalasis of left upper eyelid: Secondary | ICD-10-CM | POA: Diagnosis not present

## 2016-07-11 ENCOUNTER — Ambulatory Visit
Admission: RE | Admit: 2016-07-11 | Discharge: 2016-07-11 | Disposition: A | Discharge status: Home / Self Care | Payer: Medicare Other | Source: Ambulatory Visit | Attending: Family Medicine | Admitting: Family Medicine

## 2016-07-11 DIAGNOSIS — Z1231 Encounter for screening mammogram for malignant neoplasm of breast: Secondary | ICD-10-CM | POA: Diagnosis not present

## 2016-11-13 DIAGNOSIS — N189 Chronic kidney disease, unspecified: Secondary | ICD-10-CM | POA: Diagnosis not present

## 2016-11-13 DIAGNOSIS — M8580 Other specified disorders of bone density and structure, unspecified site: Secondary | ICD-10-CM | POA: Diagnosis not present

## 2016-11-13 DIAGNOSIS — I10 Essential (primary) hypertension: Secondary | ICD-10-CM | POA: Diagnosis not present

## 2016-11-13 DIAGNOSIS — E782 Mixed hyperlipidemia: Secondary | ICD-10-CM | POA: Diagnosis not present

## 2016-11-13 DIAGNOSIS — Z85820 Personal history of malignant melanoma of skin: Secondary | ICD-10-CM | POA: Diagnosis not present

## 2016-11-13 DIAGNOSIS — Z86010 Personal history of colonic polyps: Secondary | ICD-10-CM | POA: Diagnosis not present

## 2016-11-13 DIAGNOSIS — M899 Disorder of bone, unspecified: Secondary | ICD-10-CM | POA: Diagnosis not present

## 2016-11-13 DIAGNOSIS — R7301 Impaired fasting glucose: Secondary | ICD-10-CM | POA: Diagnosis not present

## 2016-11-20 DIAGNOSIS — R05 Cough: Secondary | ICD-10-CM | POA: Diagnosis not present

## 2016-12-23 DIAGNOSIS — H02831 Dermatochalasis of right upper eyelid: Secondary | ICD-10-CM | POA: Diagnosis not present

## 2016-12-23 DIAGNOSIS — H02834 Dermatochalasis of left upper eyelid: Secondary | ICD-10-CM | POA: Diagnosis not present

## 2016-12-23 DIAGNOSIS — H2513 Age-related nuclear cataract, bilateral: Secondary | ICD-10-CM | POA: Diagnosis not present

## 2016-12-23 DIAGNOSIS — H5203 Hypermetropia, bilateral: Secondary | ICD-10-CM | POA: Diagnosis not present

## 2016-12-23 DIAGNOSIS — Z135 Encounter for screening for eye and ear disorders: Secondary | ICD-10-CM | POA: Diagnosis not present

## 2017-02-07 DIAGNOSIS — M25571 Pain in right ankle and joints of right foot: Secondary | ICD-10-CM | POA: Diagnosis not present

## 2017-02-07 DIAGNOSIS — M2021 Hallux rigidus, right foot: Secondary | ICD-10-CM | POA: Diagnosis not present

## 2017-02-07 DIAGNOSIS — M7751 Other enthesopathy of right foot: Secondary | ICD-10-CM | POA: Diagnosis not present

## 2017-02-07 DIAGNOSIS — M79671 Pain in right foot: Secondary | ICD-10-CM | POA: Diagnosis not present

## 2017-02-21 DIAGNOSIS — M25571 Pain in right ankle and joints of right foot: Secondary | ICD-10-CM | POA: Diagnosis not present

## 2017-02-21 DIAGNOSIS — M7751 Other enthesopathy of right foot: Secondary | ICD-10-CM | POA: Diagnosis not present

## 2017-03-12 DIAGNOSIS — M25571 Pain in right ankle and joints of right foot: Secondary | ICD-10-CM | POA: Diagnosis not present

## 2017-05-15 DIAGNOSIS — Z85820 Personal history of malignant melanoma of skin: Secondary | ICD-10-CM | POA: Diagnosis not present

## 2017-05-15 DIAGNOSIS — L814 Other melanin hyperpigmentation: Secondary | ICD-10-CM | POA: Diagnosis not present

## 2017-05-15 DIAGNOSIS — L570 Actinic keratosis: Secondary | ICD-10-CM | POA: Diagnosis not present

## 2017-05-15 DIAGNOSIS — L821 Other seborrheic keratosis: Secondary | ICD-10-CM | POA: Diagnosis not present

## 2017-05-15 DIAGNOSIS — D225 Melanocytic nevi of trunk: Secondary | ICD-10-CM | POA: Diagnosis not present

## 2017-05-15 DIAGNOSIS — Z85828 Personal history of other malignant neoplasm of skin: Secondary | ICD-10-CM | POA: Diagnosis not present

## 2017-06-11 ENCOUNTER — Other Ambulatory Visit: Payer: Self-pay | Admitting: Family Medicine

## 2017-06-11 DIAGNOSIS — Z23 Encounter for immunization: Secondary | ICD-10-CM | POA: Diagnosis not present

## 2017-06-11 DIAGNOSIS — Z1231 Encounter for screening mammogram for malignant neoplasm of breast: Secondary | ICD-10-CM

## 2017-07-09 DIAGNOSIS — E669 Obesity, unspecified: Secondary | ICD-10-CM | POA: Diagnosis not present

## 2017-07-09 DIAGNOSIS — Z6832 Body mass index (BMI) 32.0-32.9, adult: Secondary | ICD-10-CM | POA: Diagnosis not present

## 2017-07-09 DIAGNOSIS — I10 Essential (primary) hypertension: Secondary | ICD-10-CM | POA: Diagnosis not present

## 2017-07-22 ENCOUNTER — Encounter: Payer: Self-pay | Admitting: Radiology

## 2017-07-22 ENCOUNTER — Ambulatory Visit
Admission: RE | Admit: 2017-07-22 | Discharge: 2017-07-22 | Disposition: A | Discharge status: Home / Self Care | Payer: Medicare Other | Source: Ambulatory Visit | Attending: Family Medicine | Admitting: Family Medicine

## 2017-07-22 DIAGNOSIS — Z1231 Encounter for screening mammogram for malignant neoplasm of breast: Secondary | ICD-10-CM | POA: Diagnosis not present

## 2017-09-18 DIAGNOSIS — I10 Essential (primary) hypertension: Secondary | ICD-10-CM | POA: Diagnosis not present

## 2017-12-18 DIAGNOSIS — Z0000 Encounter for general adult medical examination without abnormal findings: Secondary | ICD-10-CM | POA: Diagnosis not present

## 2017-12-18 DIAGNOSIS — R8290 Unspecified abnormal findings in urine: Secondary | ICD-10-CM | POA: Diagnosis not present

## 2017-12-18 DIAGNOSIS — M8580 Other specified disorders of bone density and structure, unspecified site: Secondary | ICD-10-CM | POA: Diagnosis not present

## 2017-12-18 DIAGNOSIS — Z86010 Personal history of colonic polyps: Secondary | ICD-10-CM | POA: Diagnosis not present

## 2017-12-18 DIAGNOSIS — Z85820 Personal history of malignant melanoma of skin: Secondary | ICD-10-CM | POA: Diagnosis not present

## 2017-12-18 DIAGNOSIS — I10 Essential (primary) hypertension: Secondary | ICD-10-CM | POA: Diagnosis not present

## 2017-12-18 DIAGNOSIS — E782 Mixed hyperlipidemia: Secondary | ICD-10-CM | POA: Diagnosis not present

## 2017-12-29 DIAGNOSIS — H2513 Age-related nuclear cataract, bilateral: Secondary | ICD-10-CM | POA: Diagnosis not present

## 2017-12-29 DIAGNOSIS — H02834 Dermatochalasis of left upper eyelid: Secondary | ICD-10-CM | POA: Diagnosis not present

## 2017-12-29 DIAGNOSIS — H5203 Hypermetropia, bilateral: Secondary | ICD-10-CM | POA: Diagnosis not present

## 2017-12-29 DIAGNOSIS — H02831 Dermatochalasis of right upper eyelid: Secondary | ICD-10-CM | POA: Diagnosis not present

## 2018-01-13 DIAGNOSIS — H02413 Mechanical ptosis of bilateral eyelids: Secondary | ICD-10-CM | POA: Diagnosis not present

## 2018-01-15 DIAGNOSIS — D126 Benign neoplasm of colon, unspecified: Secondary | ICD-10-CM | POA: Diagnosis not present

## 2018-01-15 DIAGNOSIS — Z86010 Personal history of colonic polyps: Secondary | ICD-10-CM | POA: Diagnosis not present

## 2018-01-21 DIAGNOSIS — D126 Benign neoplasm of colon, unspecified: Secondary | ICD-10-CM | POA: Diagnosis not present

## 2018-01-26 DIAGNOSIS — H02411 Mechanical ptosis of right eyelid: Secondary | ICD-10-CM | POA: Diagnosis not present

## 2018-01-26 DIAGNOSIS — H02413 Mechanical ptosis of bilateral eyelids: Secondary | ICD-10-CM | POA: Diagnosis not present

## 2018-01-26 DIAGNOSIS — Z01818 Encounter for other preprocedural examination: Secondary | ICD-10-CM | POA: Diagnosis not present

## 2018-01-26 DIAGNOSIS — H02403 Unspecified ptosis of bilateral eyelids: Secondary | ICD-10-CM | POA: Diagnosis not present

## 2018-01-26 DIAGNOSIS — H02412 Mechanical ptosis of left eyelid: Secondary | ICD-10-CM | POA: Diagnosis not present

## 2018-05-19 DIAGNOSIS — L570 Actinic keratosis: Secondary | ICD-10-CM | POA: Diagnosis not present

## 2018-05-19 DIAGNOSIS — C44519 Basal cell carcinoma of skin of other part of trunk: Secondary | ICD-10-CM | POA: Diagnosis not present

## 2018-05-19 DIAGNOSIS — D485 Neoplasm of uncertain behavior of skin: Secondary | ICD-10-CM | POA: Diagnosis not present

## 2018-05-19 DIAGNOSIS — Z08 Encounter for follow-up examination after completed treatment for malignant neoplasm: Secondary | ICD-10-CM | POA: Diagnosis not present

## 2018-05-19 DIAGNOSIS — Z85828 Personal history of other malignant neoplasm of skin: Secondary | ICD-10-CM | POA: Diagnosis not present

## 2018-05-19 DIAGNOSIS — Z85820 Personal history of malignant melanoma of skin: Secondary | ICD-10-CM | POA: Diagnosis not present

## 2018-05-26 DIAGNOSIS — Z23 Encounter for immunization: Secondary | ICD-10-CM | POA: Diagnosis not present

## 2018-05-26 DIAGNOSIS — C44519 Basal cell carcinoma of skin of other part of trunk: Secondary | ICD-10-CM | POA: Diagnosis not present

## 2018-05-26 DIAGNOSIS — L570 Actinic keratosis: Secondary | ICD-10-CM | POA: Diagnosis not present

## 2018-06-16 ENCOUNTER — Other Ambulatory Visit: Payer: Self-pay | Admitting: Family Medicine

## 2018-06-16 DIAGNOSIS — Z1231 Encounter for screening mammogram for malignant neoplasm of breast: Secondary | ICD-10-CM

## 2018-07-01 DIAGNOSIS — M67912 Unspecified disorder of synovium and tendon, left shoulder: Secondary | ICD-10-CM | POA: Diagnosis not present

## 2018-07-01 DIAGNOSIS — M25522 Pain in left elbow: Secondary | ICD-10-CM | POA: Diagnosis not present

## 2018-07-20 DIAGNOSIS — M25512 Pain in left shoulder: Secondary | ICD-10-CM | POA: Diagnosis not present

## 2018-07-22 DIAGNOSIS — M67912 Unspecified disorder of synovium and tendon, left shoulder: Secondary | ICD-10-CM | POA: Diagnosis not present

## 2018-07-22 DIAGNOSIS — M4722 Other spondylosis with radiculopathy, cervical region: Secondary | ICD-10-CM | POA: Diagnosis not present

## 2018-07-29 DIAGNOSIS — M542 Cervicalgia: Secondary | ICD-10-CM | POA: Diagnosis not present

## 2018-07-30 ENCOUNTER — Ambulatory Visit
Admission: RE | Admit: 2018-07-30 | Discharge: 2018-07-30 | Disposition: A | Discharge status: Home / Self Care | Payer: Medicare Other | Source: Ambulatory Visit | Attending: Family Medicine | Admitting: Family Medicine

## 2018-07-30 DIAGNOSIS — Z1231 Encounter for screening mammogram for malignant neoplasm of breast: Secondary | ICD-10-CM

## 2018-08-24 DIAGNOSIS — M4722 Other spondylosis with radiculopathy, cervical region: Secondary | ICD-10-CM | POA: Diagnosis not present

## 2018-08-25 DIAGNOSIS — M542 Cervicalgia: Secondary | ICD-10-CM | POA: Diagnosis not present

## 2018-08-31 DIAGNOSIS — R200 Anesthesia of skin: Secondary | ICD-10-CM | POA: Diagnosis not present

## 2018-09-30 DIAGNOSIS — G5602 Carpal tunnel syndrome, left upper limb: Secondary | ICD-10-CM | POA: Diagnosis not present

## 2018-10-07 DIAGNOSIS — G5602 Carpal tunnel syndrome, left upper limb: Secondary | ICD-10-CM | POA: Diagnosis not present

## 2018-10-13 DIAGNOSIS — D0471 Carcinoma in situ of skin of right lower limb, including hip: Secondary | ICD-10-CM | POA: Diagnosis not present

## 2018-10-13 DIAGNOSIS — Z08 Encounter for follow-up examination after completed treatment for malignant neoplasm: Secondary | ICD-10-CM | POA: Diagnosis not present

## 2018-10-13 DIAGNOSIS — D0461 Carcinoma in situ of skin of right upper limb, including shoulder: Secondary | ICD-10-CM | POA: Diagnosis not present

## 2018-10-13 DIAGNOSIS — Z85828 Personal history of other malignant neoplasm of skin: Secondary | ICD-10-CM | POA: Diagnosis not present

## 2018-10-13 DIAGNOSIS — L570 Actinic keratosis: Secondary | ICD-10-CM | POA: Diagnosis not present

## 2018-10-13 DIAGNOSIS — Z85820 Personal history of malignant melanoma of skin: Secondary | ICD-10-CM | POA: Diagnosis not present

## 2019-01-25 DIAGNOSIS — H02834 Dermatochalasis of left upper eyelid: Secondary | ICD-10-CM | POA: Diagnosis not present

## 2019-01-25 DIAGNOSIS — H5203 Hypermetropia, bilateral: Secondary | ICD-10-CM | POA: Diagnosis not present

## 2019-01-25 DIAGNOSIS — H2513 Age-related nuclear cataract, bilateral: Secondary | ICD-10-CM | POA: Diagnosis not present

## 2019-01-25 DIAGNOSIS — H02831 Dermatochalasis of right upper eyelid: Secondary | ICD-10-CM | POA: Diagnosis not present

## 2019-02-02 DIAGNOSIS — Z08 Encounter for follow-up examination after completed treatment for malignant neoplasm: Secondary | ICD-10-CM | POA: Diagnosis not present

## 2019-02-02 DIAGNOSIS — L570 Actinic keratosis: Secondary | ICD-10-CM | POA: Diagnosis not present

## 2019-02-02 DIAGNOSIS — D485 Neoplasm of uncertain behavior of skin: Secondary | ICD-10-CM | POA: Diagnosis not present

## 2019-02-02 DIAGNOSIS — Z85828 Personal history of other malignant neoplasm of skin: Secondary | ICD-10-CM | POA: Diagnosis not present

## 2019-02-02 DIAGNOSIS — B078 Other viral warts: Secondary | ICD-10-CM | POA: Diagnosis not present

## 2019-02-03 DIAGNOSIS — M8580 Other specified disorders of bone density and structure, unspecified site: Secondary | ICD-10-CM | POA: Diagnosis not present

## 2019-02-03 DIAGNOSIS — R7301 Impaired fasting glucose: Secondary | ICD-10-CM | POA: Diagnosis not present

## 2019-02-03 DIAGNOSIS — Z85820 Personal history of malignant melanoma of skin: Secondary | ICD-10-CM | POA: Diagnosis not present

## 2019-02-03 DIAGNOSIS — E782 Mixed hyperlipidemia: Secondary | ICD-10-CM | POA: Diagnosis not present

## 2019-02-03 DIAGNOSIS — I10 Essential (primary) hypertension: Secondary | ICD-10-CM | POA: Diagnosis not present

## 2019-02-03 DIAGNOSIS — Z1159 Encounter for screening for other viral diseases: Secondary | ICD-10-CM | POA: Diagnosis not present

## 2019-02-03 DIAGNOSIS — Z86010 Personal history of colonic polyps: Secondary | ICD-10-CM | POA: Diagnosis not present

## 2019-02-03 DIAGNOSIS — Z0000 Encounter for general adult medical examination without abnormal findings: Secondary | ICD-10-CM | POA: Diagnosis not present

## 2019-02-03 DIAGNOSIS — R8290 Unspecified abnormal findings in urine: Secondary | ICD-10-CM | POA: Diagnosis not present

## 2019-02-03 DIAGNOSIS — N189 Chronic kidney disease, unspecified: Secondary | ICD-10-CM | POA: Diagnosis not present

## 2019-02-10 ENCOUNTER — Other Ambulatory Visit: Payer: Self-pay | Admitting: Family Medicine

## 2019-02-10 DIAGNOSIS — M8580 Other specified disorders of bone density and structure, unspecified site: Secondary | ICD-10-CM

## 2019-04-26 ENCOUNTER — Other Ambulatory Visit: Payer: Self-pay

## 2019-04-26 ENCOUNTER — Ambulatory Visit
Admission: RE | Admit: 2019-04-26 | Discharge: 2019-04-26 | Disposition: A | Discharge status: Home / Self Care | Payer: Medicare Other | Source: Ambulatory Visit | Attending: Family Medicine | Admitting: Family Medicine

## 2019-04-26 DIAGNOSIS — M85852 Other specified disorders of bone density and structure, left thigh: Secondary | ICD-10-CM | POA: Diagnosis not present

## 2019-04-26 DIAGNOSIS — Z780 Asymptomatic menopausal state: Secondary | ICD-10-CM | POA: Diagnosis not present

## 2019-04-26 DIAGNOSIS — M8580 Other specified disorders of bone density and structure, unspecified site: Secondary | ICD-10-CM

## 2019-05-05 DIAGNOSIS — M8580 Other specified disorders of bone density and structure, unspecified site: Secondary | ICD-10-CM | POA: Diagnosis not present

## 2019-05-05 DIAGNOSIS — I10 Essential (primary) hypertension: Secondary | ICD-10-CM | POA: Diagnosis not present

## 2019-05-05 DIAGNOSIS — E782 Mixed hyperlipidemia: Secondary | ICD-10-CM | POA: Diagnosis not present

## 2019-05-05 DIAGNOSIS — N189 Chronic kidney disease, unspecified: Secondary | ICD-10-CM | POA: Diagnosis not present

## 2019-05-31 DIAGNOSIS — Z23 Encounter for immunization: Secondary | ICD-10-CM | POA: Diagnosis not present

## 2019-06-08 DIAGNOSIS — L821 Other seborrheic keratosis: Secondary | ICD-10-CM | POA: Diagnosis not present

## 2019-06-08 DIAGNOSIS — B078 Other viral warts: Secondary | ICD-10-CM | POA: Diagnosis not present

## 2019-06-08 DIAGNOSIS — Z86008 Personal history of in-situ neoplasm of other site: Secondary | ICD-10-CM | POA: Diagnosis not present

## 2019-06-21 ENCOUNTER — Other Ambulatory Visit: Payer: Self-pay | Admitting: Family Medicine

## 2019-06-21 DIAGNOSIS — Z1231 Encounter for screening mammogram for malignant neoplasm of breast: Secondary | ICD-10-CM

## 2019-08-11 ENCOUNTER — Other Ambulatory Visit: Payer: Self-pay

## 2019-08-11 ENCOUNTER — Ambulatory Visit
Admission: RE | Admit: 2019-08-11 | Discharge: 2019-08-11 | Disposition: A | Discharge status: Home / Self Care | Payer: Medicare Other | Source: Ambulatory Visit | Attending: Family Medicine | Admitting: Family Medicine

## 2019-08-11 DIAGNOSIS — Z1231 Encounter for screening mammogram for malignant neoplasm of breast: Secondary | ICD-10-CM

## 2019-10-18 DIAGNOSIS — M79644 Pain in right finger(s): Secondary | ICD-10-CM | POA: Diagnosis not present

## 2019-10-18 DIAGNOSIS — M25531 Pain in right wrist: Secondary | ICD-10-CM | POA: Diagnosis not present

## 2019-10-28 DIAGNOSIS — C44529 Squamous cell carcinoma of skin of other part of trunk: Secondary | ICD-10-CM | POA: Diagnosis not present

## 2019-10-28 DIAGNOSIS — D0362 Melanoma in situ of left upper limb, including shoulder: Secondary | ICD-10-CM | POA: Diagnosis not present

## 2019-10-28 DIAGNOSIS — L570 Actinic keratosis: Secondary | ICD-10-CM | POA: Diagnosis not present

## 2019-10-28 DIAGNOSIS — R238 Other skin changes: Secondary | ICD-10-CM | POA: Diagnosis not present

## 2019-10-28 DIAGNOSIS — D0461 Carcinoma in situ of skin of right upper limb, including shoulder: Secondary | ICD-10-CM | POA: Diagnosis not present

## 2019-10-28 DIAGNOSIS — Z85820 Personal history of malignant melanoma of skin: Secondary | ICD-10-CM | POA: Diagnosis not present

## 2019-10-28 DIAGNOSIS — D485 Neoplasm of uncertain behavior of skin: Secondary | ICD-10-CM | POA: Diagnosis not present

## 2019-11-11 DIAGNOSIS — D0362 Melanoma in situ of left upper limb, including shoulder: Secondary | ICD-10-CM | POA: Diagnosis not present

## 2019-11-23 DIAGNOSIS — D0362 Melanoma in situ of left upper limb, including shoulder: Secondary | ICD-10-CM | POA: Diagnosis not present

## 2019-11-23 DIAGNOSIS — D0461 Carcinoma in situ of skin of right upper limb, including shoulder: Secondary | ICD-10-CM | POA: Diagnosis not present

## 2019-11-23 DIAGNOSIS — C44529 Squamous cell carcinoma of skin of other part of trunk: Secondary | ICD-10-CM | POA: Diagnosis not present

## 2020-02-09 ENCOUNTER — Other Ambulatory Visit (HOSPITAL_COMMUNITY): Payer: Self-pay | Admitting: Family Medicine

## 2020-02-09 DIAGNOSIS — J309 Allergic rhinitis, unspecified: Secondary | ICD-10-CM | POA: Diagnosis not present

## 2020-02-09 DIAGNOSIS — R011 Cardiac murmur, unspecified: Secondary | ICD-10-CM

## 2020-02-09 DIAGNOSIS — M8580 Other specified disorders of bone density and structure, unspecified site: Secondary | ICD-10-CM | POA: Diagnosis not present

## 2020-02-09 DIAGNOSIS — R7301 Impaired fasting glucose: Secondary | ICD-10-CM | POA: Diagnosis not present

## 2020-02-09 DIAGNOSIS — Z86010 Personal history of colonic polyps: Secondary | ICD-10-CM | POA: Diagnosis not present

## 2020-02-09 DIAGNOSIS — Z0000 Encounter for general adult medical examination without abnormal findings: Secondary | ICD-10-CM | POA: Diagnosis not present

## 2020-02-09 DIAGNOSIS — N189 Chronic kidney disease, unspecified: Secondary | ICD-10-CM | POA: Diagnosis not present

## 2020-02-09 DIAGNOSIS — I10 Essential (primary) hypertension: Secondary | ICD-10-CM | POA: Diagnosis not present

## 2020-02-09 DIAGNOSIS — G4700 Insomnia, unspecified: Secondary | ICD-10-CM | POA: Diagnosis not present

## 2020-02-09 DIAGNOSIS — E782 Mixed hyperlipidemia: Secondary | ICD-10-CM | POA: Diagnosis not present

## 2020-02-09 DIAGNOSIS — R8290 Unspecified abnormal findings in urine: Secondary | ICD-10-CM | POA: Diagnosis not present

## 2020-02-09 DIAGNOSIS — Z85820 Personal history of malignant melanoma of skin: Secondary | ICD-10-CM | POA: Diagnosis not present

## 2020-02-17 ENCOUNTER — Ambulatory Visit (HOSPITAL_COMMUNITY): Payer: Medicare Other | Attending: Cardiology

## 2020-02-17 ENCOUNTER — Other Ambulatory Visit: Payer: Self-pay

## 2020-02-17 DIAGNOSIS — R011 Cardiac murmur, unspecified: Secondary | ICD-10-CM | POA: Insufficient documentation

## 2020-02-22 ENCOUNTER — Other Ambulatory Visit (HOSPITAL_COMMUNITY): Payer: Medicare Other

## 2020-02-23 DIAGNOSIS — R05 Cough: Secondary | ICD-10-CM | POA: Diagnosis not present

## 2020-02-23 DIAGNOSIS — I10 Essential (primary) hypertension: Secondary | ICD-10-CM | POA: Diagnosis not present

## 2020-02-24 ENCOUNTER — Other Ambulatory Visit: Payer: Self-pay

## 2020-02-24 ENCOUNTER — Encounter: Payer: Self-pay | Admitting: Pulmonary Disease

## 2020-02-24 ENCOUNTER — Ambulatory Visit (INDEPENDENT_AMBULATORY_CARE_PROVIDER_SITE_OTHER): Payer: Medicare Other | Admitting: Pulmonary Disease

## 2020-02-24 DIAGNOSIS — R05 Cough: Secondary | ICD-10-CM

## 2020-02-24 DIAGNOSIS — R053 Chronic cough: Secondary | ICD-10-CM

## 2020-02-24 NOTE — Assessment & Plan Note (Addendum)
obtain chest x-ray report from Dr. Eddie Dibbles office.  Cough for 3 months with clear chest x-ray, may be due to postnasal drip/allergies or reflux, less likely asthma She had a similar episode in 2014 and this was felt to be upper airway cough with reflux contributing I do suspect that silent reflux is still an ongoing issue although she does not have overt symptoms  Trial of chlorpheniramine 4 mg OTC at bedtime for 2 weeks Trial of store brand phenylephrine/walphed 10 mg in the daytime for 2 weeks, this may raise your blood pressure a few points Tessalon Perles 200 mg as needed for cough  Call in 2 weeks to report if cough is improved.  If not we will consider further testing such as esophagram although prior CT abdomen from 2011 does not show any evidence of hiatal hernia Doubt CT chest helpful in the setting  Stay on omeprazole 20 twice daily

## 2020-02-24 NOTE — Patient Instructions (Signed)
  We will obtain chest x-ray report from Dr. Eddie Dibbles office.  Cough for 3 months with clear chest x-ray, may be due to postnasal drip/allergies or reflux, less likely asthma  Trial of chlorpheniramine 4 mg OTC at bedtime for 2 weeks Trial of store brand phenylephrine/walphed 10 mg in the daytime for 2 weeks, this may raise your blood pressure a few points  Call in 2 weeks to report if cough is improved.  Stay on omeprazole 20 twice daily

## 2020-02-24 NOTE — Progress Notes (Signed)
Subjective:    Patient ID: Ann Cohen, female    DOB: Mar 15, 1946, 74 y.o.   MRN: [id number]  HPI  74 year old never smoker presents for evaluation of chronic cough for 3 months.  She reports cough of insidious onset, mostly dry, no specific trigger except long conversations.  No diurnal variation although this does not wake her up at night.  She has not tried anything over-the-counter, she saw PCP yesterday and was given a Depo-Medrol 120 mg shot and Tessalon Perles to take after 3 days.  She has not noticed any improvement last 24 hours. She denies obvious reflux symptoms but does take omeprazole 20 twice daily for history of peptic ulcer disease.  She also has Nexium to take as needed. She does report seasonal allergies and uses Flonase for nasal congestion.  She denies any symptoms and has never been formally allergy tested.  She was evaluated in our office in 5397 for similar cyclical cough which was attributed to upper airway cough versus reflux.  I reviewed her prior testing including CT abdomen from 09/2009 which shows clear lungs and no evidence of hiatal hernia  She is accompanied by her husband. Environment -no change, no new pets, no mold exposure  Reviewed PCP notes, prior evaluation from 2014 and labs which were normal  Significant tests/ events reviewed Echocardiogram 01/2020 normal LVEF, grade 1 diastolic dysfunction, RVSP 36  Spirometry 09/2012 >> no airway obstruction     Past Medical History:  Diagnosis Date  . HTN (hypertension)   . Hyperlipidemia   . Melanoma ([place])    right shoulder(back)  . Seasonal allergies     Past Surgical History:  Procedure Laterality Date  . ABDOMINAL HYSTERECTOMY  17 years ago  . BREAST BIOPSY Right    benign  . GALLBLADDER SURGERY  15 years ago  . TONSILLECTOMY  18-75 years of age  . TUBAL LIGATION  41 years ago    Allergies  Allergen Reactions  . Codeine   . Sulfur     Social History   Socioeconomic History  .  Marital status: Married    Spouse name: Not on file  . Number of children: Not on file  . Years of education: Not on file  . Highest education level: Not on file  Occupational History  . Occupation: unemployeed//retired  Tobacco Use  . Smoking status: Never Smoker  . Smokeless tobacco: Never Used  Substance and Sexual Activity  . Alcohol use: Yes    Comment: glass wine daily  . Drug use: No  . Sexual activity: Not on file  Other Topics Concern  . Not on file  Social History Narrative   Recent travel to [place] 01/7340--- x 1 week   Social Determinants of Health   Financial Resource Strain:   . Difficulty of Paying Living Expenses:   Food Insecurity:   . Worried About Charity fundraiser in the Last Year:   . Arboriculturist in the Last Year:   Transportation Needs:   . Film/video editor (Medical):   Marland Kitchen Lack of Transportation (Non-Medical):   Physical Activity:   . Days of Exercise per Week:   . Minutes of Exercise per Session:   Stress:   . Feeling of Stress :   Social Connections:   . Frequency of Communication with Friends and Family:   . Frequency of Social Gatherings with Friends and Family:   . Attends Religious Services:   . Active Member of Clubs  or Organizations:   . Attends Archivist Meetings:   Marland Kitchen Marital Status:   Intimate Partner Violence:   . Fear of Current or Ex-Partner:   . Emotionally Abused:   Marland Kitchen Physically Abused:   . Sexually Abused:      Family History  Problem Relation Age of Onset  . Heart disease Father   . Cancer Father        possible lung ca  . Cancer Mother      Review of Systems Constitutional: negative for anorexia, fevers and sweats  Eyes: negative for irritation, redness and visual disturbance  Ears, nose, mouth, throat, and face: negative for earaches, epistaxis, nasal congestion and sore throat  Respiratory: negative for  dyspnea on exertion, sputum and wheezing  Cardiovascular: negative for chest  pain, dyspnea, lower extremity edema, orthopnea, palpitations and syncope  Gastrointestinal: negative for abdominal pain, constipation, diarrhea, melena, nausea and vomiting  Genitourinary:negative for dysuria, frequency and hematuria  Hematologic/lymphatic: negative for bleeding, easy bruising and lymphadenopathy  Musculoskeletal:negative for arthralgias, muscle weakness and stiff joints  Neurological: negative for coordination problems, gait problems, headaches and weakness  Endocrine: negative for diabetic symptoms including polydipsia, polyuria and weight loss     Objective:   Physical Exam  Gen. Pleasant, obese, in no distress, normal affect ENT - no pallor,icterus, no post nasal drip, class 2-3 airway Neck: No JVD, no thyromegaly, no carotid bruits Lungs: no use of accessory muscles, no dullness to percussion, decreased without rales or rhonchi  Cardiovascular: Rhythm regular, heart sounds  normal, no murmurs or gallops, no peripheral edema Abdomen: soft and non-tender, no hepatosplenomegaly, BS normal. Musculoskeletal: No deformities, no cyanosis or clubbing Neuro:  alert, non focal, no tremors       Assessment & Plan:

## 2020-03-03 ENCOUNTER — Other Ambulatory Visit (HOSPITAL_COMMUNITY): Payer: Medicare Other

## 2020-03-06 ENCOUNTER — Encounter: Payer: Self-pay | Admitting: Primary Care

## 2020-03-06 ENCOUNTER — Other Ambulatory Visit: Payer: Self-pay

## 2020-03-06 ENCOUNTER — Ambulatory Visit (INDEPENDENT_AMBULATORY_CARE_PROVIDER_SITE_OTHER): Payer: Medicare Other | Admitting: Primary Care

## 2020-03-06 DIAGNOSIS — R053 Chronic cough: Secondary | ICD-10-CM

## 2020-03-06 DIAGNOSIS — R05 Cough: Secondary | ICD-10-CM

## 2020-03-06 DIAGNOSIS — R059 Cough, unspecified: Secondary | ICD-10-CM

## 2020-03-06 MED ORDER — PREDNISONE 10 MG PO TABS: ORAL_TABLET | ORAL | 0 refills | Status: DC

## 2020-03-06 NOTE — Progress Notes (Signed)
@Patient  ID: Ann Cohen, female    DOB: 05/15/1946, 74 y.o.   MRN: [id number]  Chief Complaint  Patient presents with  . Follow-up    Referring provider: Hulan Fess, MD  HPI: 74 year old female, never smoked (second hand exposure).  Past medical history significant for HTN, hyperlipidemia, melanoma, chronic cough.  Former patient of Dr. Gwenette Greet, seen for initial consult with Dr. Elsworth Soho on February 24, 2020 for chronic cough x 3 months.    She was evaluated in our office in 4628 for similar cyclical cough which was attributed to upper airway versus reflux.  She had a CT abdomen in 2011 which showed clear lungs and no evidence of a hiatal hernia.  No significant environmental exposures. CXR with Dr. Rex Kras was clear. Cough felt to be due to postnasal drip/allergies or reflux, less likely asthma.  Trial chlorphentermine 4 mg over-the-counter at bedtime for 2 weeks. Continue Omeprazole 20mg  twice daily   03/06/2020 Patient presents today for 1-2 week follow-up cough. Reports that she has had a cough for several months. She is no better after recommendations from Dr. Elsworth Soho. States that her cough is worse when she talks, she has some associated shortness of breath with cough. She reports dizzines when playing pickle ball. Her blood pressure medication is being adjusted by her PCP. She has seasonal allergies in spring and fall. She uses flonase as needed only. She is complaint with omeprazole twice a day. She had steriod shot with Dr. Rex Kras office which may have helped for 24 hours. She has a prescription for tesslon perles but did not filled this.  She wants to get to the bottom of why she is coughing. Family hx lung cancer. Her father smoked and sister had stage 4 lung cancer.   Allergies  Allergen Reactions  . Codeine   . Sulfur     Immunization History  Administered Date(s) Administered  . Influenza Nasal 06/14/2019  . Influenza Split 05/26/2012  . PFIZER SARS-COV-2 Vaccination 09/10/2019,  10/01/2019    Past Medical History:  Diagnosis Date  . HTN (hypertension)   . Hyperlipidemia   . Melanoma ([place])    right shoulder(back)  . Seasonal allergies     Tobacco History: Social History   Tobacco Use  Smoking Status Never Smoker  Smokeless Tobacco Never Used   Counseling given: Not Answered   Outpatient Medications Prior to Visit  Medication Sig Dispense Refill  . amLODipine (NORVASC) 2.5 MG tablet Take 2.5 mg by mouth daily.    Marland Kitchen atorvastatin (LIPITOR) 10 MG tablet Take 10 mg by mouth daily.    . fluticasone (FLONASE) 50 MCG/ACT nasal spray Place 2 sprays into the nose as needed for rhinitis.    . hydrochlorothiazide (HYDRODIURIL) 12.5 MG tablet Take 12.5 mg by mouth daily.    Marland Kitchen NEXIUM 40 MG capsule Take 80 mg by mouth daily.    Marland Kitchen omeprazole (PRILOSEC) 20 MG capsule Take 20 mg by mouth 2 (two) times daily.    . Acetaminophen (TYLENOL ARTHRITIS PAIN PO) Take 2 tablets by mouth daily as needed. (Patient not taking: Reported on 03/06/2020)    . famotidine (PEPCID) 20 MG tablet Take 20 mg by mouth daily. (Patient not taking: Reported on 03/06/2020)     No facility-administered medications prior to visit.    Review of Systems  Review of Systems  Constitutional: Negative.   Respiratory: Positive for cough and shortness of breath. Negative for chest tightness and wheezing.   Cardiovascular: Negative.    Physical  Exam  BP 130/80 (BP Location: Left Arm, Cuff Size: Normal)   Pulse 67   Temp 98.2 F (36.8 C) (Oral)   Ht 5' (1.524 m)   Wt 169 lb (76.7 kg)   SpO2 98%   BMI 33.01 kg/m  Physical Exam Constitutional:      Appearance: Normal appearance.  HENT:     Head: Normocephalic and atraumatic.     Right Ear: Tympanic membrane normal. There is no impacted cerumen.     Left Ear: Tympanic membrane normal. There is no impacted cerumen.     Mouth/Throat:     Mouth: Mucous membranes are moist.     Pharynx: Oropharynx is clear. No oropharyngeal exudate or posterior  oropharyngeal erythema.  Cardiovascular:     Rate and Rhythm: Normal rate and regular rhythm.  Pulmonary:     Effort: Pulmonary effort is normal. No respiratory distress.     Breath sounds: No stridor. No rhonchi or rales.     Comments: LSC Musculoskeletal:        General: Normal range of motion.  Skin:    General: Skin is warm and dry.  Neurological:     General: No focal deficit present.     Mental Status: She is alert and oriented to person, place, and time. Mental status is at baseline.  Psychiatric:        Thought Content: Thought content normal.      Lab Results:  CBC No results found for: WBC, RBC, HGB, HCT, PLT, MCV, MCH, MCHC, RDW, LYMPHSABS, MONOABS, EOSABS, BASOSABS  BMET No results found for: NA, K, CL, CO2, GLUCOSE, BUN, CREATININE, CALCIUM, GFRNONAA, GFRAA  BNP No results found for: BNP  ProBNP No results found for: PROBNP  Imaging: ECHOCARDIOGRAM COMPLETE  Result Date: 02/17/2020    ECHOCARDIOGRAM REPORT   Patient Name:   Ann Cohen Date of Exam: 02/17/2020 Medical Rec #:  [id number]       Height:       60.0 in Accession #:    [id number]      Weight:       175.0 lb Date of Birth:  02-15-1946       BSA:          1.764 m Patient Age:    42 years        BP:           150/86 mmHg Patient Gender: F               HR:           72 bpm. Exam Location:  [facility] Procedure: 2D Echo, 3D Echo, Cardiac Doppler and Color Doppler Indications:    R01.1 Murmur  History:        Patient has no prior history of Echocardiogram examinations.                 Mitral Valve Prolapse, Signs/Symptoms:Murmur and Shortness of                 Breath; Risk Factors:Hypertension, Dyslipidemia and Family                 History of Coronary Artery Disease. Chronic Kidney Disease, New                 SOB and Cough.  Sonographer:    Deliah Boston RDCS Referring Phys: [place]  1. Left ventricular ejection fraction, by estimation, is 60 to 65%. The left ventricle has  normal function. The left ventricle has no regional wall motion abnormalities. Left ventricular diastolic parameters are consistent with Grade I diastolic dysfunction (impaired relaxation). Elevated left ventricular end-diastolic pressure.  2. Right ventricular systolic function is normal. The right ventricular size is normal. There is mildly elevated pulmonary artery systolic pressure. The estimated right ventricular systolic pressure is 35.3 mmHg.  3. The mitral valve is normal in structure. No Mitral valve prolapse noted. Trivial mitral valve regurgitation. No evidence of mitral stenosis.  4. The aortic valve is normal in structure. Aortic valve regurgitation is not visualized. No aortic stenosis is present.  5. The inferior vena cava is normal in size with greater than 50% respiratory variability, suggesting right atrial pressure of 3 mmHg. FINDINGS  Left Ventricle: Left ventricular ejection fraction, by estimation, is 60 to 65%. The left ventricle has normal function. The left ventricle has no regional wall motion abnormalities. The left ventricular internal cavity size was normal in size. There is  no left ventricular hypertrophy. Left ventricular diastolic parameters are consistent with Grade I diastolic dysfunction (impaired relaxation). Elevated left ventricular end-diastolic pressure. Right Ventricle: The right ventricular size is normal. No increase in right ventricular wall thickness. Right ventricular systolic function is normal. There is mildly elevated pulmonary artery systolic pressure. The tricuspid regurgitant velocity is 2.64  m/s, and with an assumed right atrial pressure of 8 mmHg, the estimated right ventricular systolic pressure is 61.4 mmHg. Left Atrium: Left atrial size was normal in size. Right Atrium: Right atrial size was normal in size. Pericardium: There is no evidence of pericardial effusion. Mitral Valve: The mitral valve is normal in structure. Normal mobility of the mitral valve  leaflets. Trivial mitral valve regurgitation. No evidence of mitral valve stenosis. Tricuspid Valve: The tricuspid valve is normal in structure. Tricuspid valve regurgitation is trivial. No evidence of tricuspid stenosis. Aortic Valve: The aortic valve is normal in structure. Aortic valve regurgitation is not visualized. No aortic stenosis is present. Aortic valve mean gradient measures 10.0 mmHg. Aortic valve peak gradient measures 17.3 mmHg. Aortic valve area, by VTI measures 2.74 cm. Pulmonic Valve: The pulmonic valve was normal in structure. Pulmonic valve regurgitation is trivial. No evidence of pulmonic stenosis. Aorta: The aortic root is normal in size and structure. Venous: The inferior vena cava is normal in size with greater than 50% respiratory variability, suggesting right atrial pressure of 3 mmHg. IAS/Shunts: No atrial level shunt detected by color flow Doppler.  LEFT VENTRICLE PLAX 2D LVIDd:         3.99 cm  Diastology LVIDs:         2.11 cm  LV e' lateral:   5.44 cm/s LV PW:         0.92 cm  LV E/e' lateral: 12.4 LV IVS:        1.05 cm  LV e' medial:    4.79 cm/s LVOT diam:     2.60 cm  LV E/e' medial:  14.1 LV SV:         122 LV SV Index:   69 LVOT Area:     5.31 cm                          3D Volume EF:                         3D EF:        63 %  LV EDV:       111 ml                         LV ESV:       41 ml                         LV SV:        70 ml RIGHT VENTRICLE RV S prime:     13.20 cm/s TAPSE (M-mode): 2.0 cm LEFT ATRIUM             Index       RIGHT ATRIUM           Index LA diam:        4.00 cm 2.27 cm/m  RA Area:     14.90 cm LA Vol (A2C):   46.7 ml 26.48 ml/m RA Volume:   38.50 ml  21.83 ml/m LA Vol (A4C):   34.2 ml 19.39 ml/m LA Biplane Vol: 41.5 ml 23.53 ml/m  AORTIC VALVE AV Area (Vmax):    2.75 cm AV Area (Vmean):   2.44 cm AV Area (VTI):     2.74 cm AV Vmax:           208.25 cm/s AV Vmean:          147.000 cm/s AV VTI:            0.444 m AV Peak  Grad:      17.3 mmHg AV Mean Grad:      10.0 mmHg LVOT Vmax:         108.00 cm/s LVOT Vmean:        67.600 cm/s LVOT VTI:          0.229 m LVOT/AV VTI ratio: 0.52  AORTA Ao Root diam: 3.15 cm Ao Asc diam:  3.45 cm MITRAL VALVE               TRICUSPID VALVE MV Area (PHT): cm         TR Peak grad:   27.9 mmHg MV Decel Time: 254 msec    TR Vmax:        264.00 cm/s MV E velocity: 67.40 cm/s MV A velocity: 87.80 cm/s  SHUNTS MV E/A ratio:  0.77        Systemic VTI:  0.23 m                            Systemic Diam: 2.60 cm Fransico Him MD Electronically signed by Fransico Him MD Signature Date/Time: 02/17/2020/5:34:45 PM    Final      Assessment & Plan:   Chronic cough - Persistent cough for 3-6 months or longer. She has had no improvement with PPI, chlorphiramine, flonase. Short term relief with steroid injection. Prior imaging has been unrevealing. Her sister had stage 4 lung cancer and was non-smoker. Patient requesting CT imaging which I believe is revealing d/t family hx and chronic nature of her cough to r/o underlying noules or interstitial lung disease.  - Recommend oral prednisone tape and patient fill tessalon perles for cough suppression.  - Advised patient avoid coughing by taking sips of water and using hard candies (avoid mint and menthol products). Goal is to suppress cough for 2-3 days if possible and then taper medications as able  - If CT imaging is normal, recommend pulmonary function testing/FENO  Orders: -  HRCT in 1 week re: persistent cough (ordered)  Follow-up: - 4 weeks with Dr. Candise Che, NP 03/06/2020

## 2020-03-06 NOTE — Assessment & Plan Note (Addendum)
-   Persistent cough for 3-6 months or longer. She has had no improvement with PPI, chlorphiramine, flonase. Short term relief with steroid injection. Prior imaging has been unrevealing. Her sister had stage 4 lung cancer and was non-smoker. Patient requesting CT imaging which I believe is revealing d/t family hx and chronic nature of her cough to r/o underlying noules or interstitial lung disease.  - Recommend oral prednisone tape and patient fill tessalon perles for cough suppression.  - Advised patient avoid coughing by taking sips of water and using hard candies (avoid mint and menthol products). Goal is to suppress cough for 2-3 days if possible and then taper medications as able  - If CT imaging is normal, recommend pulmonary function testing/FENO  Orders: - HRCT in 1 week re: persistent cough (ordered)  Follow-up: - 4 weeks with Dr. Elsworth Soho

## 2020-03-06 NOTE — Patient Instructions (Addendum)
Recommendation: - Avoid coughing by taking sips of water and using hard candies (avoid mint and menthol products) - Fill tessalon perles, takes three times a day as needed for cough suppression - We will send in a prednisone taper for cough - Goal is to suppress cough for 2-3 days if possible and then taper medications as able  - If CT imaging is normal, recommend pulmonary function testing/FENO  Orders: - HRCT in 1 week re: persistent cough (ordered)  Follow-up: - 4 weeks with Dr. Elsworth Soho

## 2020-03-16 ENCOUNTER — Ambulatory Visit (HOSPITAL_COMMUNITY)
Admission: RE | Admit: 2020-03-16 | Discharge: 2020-03-16 | Disposition: A | Discharge status: Home / Self Care | Payer: Medicare Other | Source: Ambulatory Visit | Attending: Primary Care | Admitting: Primary Care

## 2020-03-16 ENCOUNTER — Other Ambulatory Visit: Payer: Self-pay

## 2020-03-16 DIAGNOSIS — R918 Other nonspecific abnormal finding of lung field: Secondary | ICD-10-CM | POA: Diagnosis not present

## 2020-03-16 DIAGNOSIS — R05 Cough: Secondary | ICD-10-CM | POA: Diagnosis not present

## 2020-03-16 DIAGNOSIS — R059 Cough, unspecified: Secondary | ICD-10-CM

## 2020-03-20 MED ORDER — FLOVENT HFA 44 MCG/ACT IN AERO: 2.0000 | INHALATION_SPRAY | Freq: Two times a day (BID) | RESPIRATORY_TRACT | 12 refills | Status: AC

## 2020-03-20 NOTE — Telephone Encounter (Signed)
No evidence of interstitial lung disease on HRCT. Since she has improved with prednisone recommend trying steroid inhaler. Continue omeprazole twice daily. Would recommend getting spirometry before he visit with Elsworth Soho if able.   Can you send in a prescription for Flovent 101mcg two puffs twice daily (rinse mouth after use)

## 2020-03-20 NOTE — Progress Notes (Signed)
HRCT showed no evidence of ILD. There was incidental 2cm thyroid nodule, needs thyroid ultrasound first available   Please order thyroid ultrasound re: solitary thyroid nodule >1cm

## 2020-03-20 NOTE — Telephone Encounter (Signed)
Called and spoke with patient to let her know about message from [place]. She verified which pharmacy she would like for me to send inhaler to and also has appt with [facility] on 8/9. Patient has had vaccines and can do spirometry at her visit. Patient expressed understanding. Nothing further needed at this time

## 2020-03-20 NOTE — Telephone Encounter (Signed)
Beth please advise on patients mychart message  After seeing the results of my CT scan through mychart, and my symptoms still being the same (still coughing after prednisone and tessalon pearls) do I have to wait for our next appointment (Aug 9) for the next step. I need to find some answers ASAP.

## 2020-03-22 ENCOUNTER — Other Ambulatory Visit: Payer: Self-pay | Admitting: Primary Care

## 2020-03-22 ENCOUNTER — Telehealth: Payer: Self-pay | Admitting: Primary Care

## 2020-03-22 DIAGNOSIS — E041 Nontoxic single thyroid nodule: Secondary | ICD-10-CM

## 2020-03-22 NOTE — Telephone Encounter (Signed)
Pt was returning my call.  [place] is sched for 8/3 at 10:15.  Gave pt appt info.  Nothing further needed.

## 2020-03-22 NOTE — Progress Notes (Signed)
Spoke with pt and notified of results per Beth.  Pt verbalized understanding and denied any questions. 

## 2020-03-28 ENCOUNTER — Other Ambulatory Visit: Payer: Medicare Other

## 2020-03-28 ENCOUNTER — Ambulatory Visit: Payer: Medicare Other | Admitting: Adult Health

## 2020-03-29 ENCOUNTER — Other Ambulatory Visit: Payer: Self-pay | Admitting: Primary Care

## 2020-03-29 ENCOUNTER — Ambulatory Visit
Admission: RE | Admit: 2020-03-29 | Discharge: 2020-03-29 | Disposition: A | Discharge status: Home / Self Care | Payer: Medicare Other | Source: Ambulatory Visit | Attending: Primary Care | Admitting: Primary Care

## 2020-03-29 DIAGNOSIS — E041 Nontoxic single thyroid nodule: Secondary | ICD-10-CM

## 2020-03-29 NOTE — Progress Notes (Signed)
Order placed for thyroid biopsy

## 2020-03-29 NOTE — Progress Notes (Signed)
Please refer patient to IR for thyroid bx

## 2020-04-03 ENCOUNTER — Ambulatory Visit: Payer: Medicare Other | Admitting: Primary Care

## 2020-04-06 ENCOUNTER — Other Ambulatory Visit (HOSPITAL_COMMUNITY)
Admission: RE | Admit: 2020-04-06 | Discharge: 2020-04-06 | Disposition: A | Discharge status: Home / Self Care | Payer: Medicare Other | Source: Ambulatory Visit | Attending: Diagnostic Radiology | Admitting: Diagnostic Radiology

## 2020-04-06 ENCOUNTER — Ambulatory Visit
Admission: RE | Admit: 2020-04-06 | Discharge: 2020-04-06 | Disposition: A | Discharge status: Home / Self Care | Payer: Medicare Other | Source: Ambulatory Visit | Attending: Primary Care | Admitting: Primary Care

## 2020-04-06 DIAGNOSIS — E041 Nontoxic single thyroid nodule: Secondary | ICD-10-CM | POA: Insufficient documentation

## 2020-04-07 LAB — CYTOLOGY - NON PAP

## 2020-04-07 NOTE — Progress Notes (Signed)
Spoke with the pt and made aware of results.

## 2020-04-07 NOTE — Progress Notes (Signed)
Please let patient know IR bx was consistent with benign follicular nodule

## 2020-04-12 ENCOUNTER — Other Ambulatory Visit: Payer: Medicare Other

## 2020-04-17 DIAGNOSIS — H25813 Combined forms of age-related cataract, bilateral: Secondary | ICD-10-CM | POA: Diagnosis not present

## 2020-04-17 DIAGNOSIS — Z135 Encounter for screening for eye and ear disorders: Secondary | ICD-10-CM | POA: Diagnosis not present

## 2020-04-17 DIAGNOSIS — H5203 Hypermetropia, bilateral: Secondary | ICD-10-CM | POA: Diagnosis not present

## 2020-04-17 DIAGNOSIS — H353130 Nonexudative age-related macular degeneration, bilateral, stage unspecified: Secondary | ICD-10-CM | POA: Diagnosis not present

## 2020-04-18 ENCOUNTER — Ambulatory Visit (INDEPENDENT_AMBULATORY_CARE_PROVIDER_SITE_OTHER): Payer: Medicare Other | Admitting: Primary Care

## 2020-04-18 ENCOUNTER — Other Ambulatory Visit: Payer: Self-pay

## 2020-04-18 ENCOUNTER — Encounter: Payer: Self-pay | Admitting: Primary Care

## 2020-04-18 VITALS — BP 110/83 | HR 85 | Temp 98.0°F | Ht 60.0 in | Wt 168.0 lb

## 2020-04-18 DIAGNOSIS — R05 Cough: Secondary | ICD-10-CM

## 2020-04-18 DIAGNOSIS — E041 Nontoxic single thyroid nodule: Secondary | ICD-10-CM

## 2020-04-18 DIAGNOSIS — R053 Chronic cough: Secondary | ICD-10-CM

## 2020-04-18 DIAGNOSIS — R059 Cough, unspecified: Secondary | ICD-10-CM

## 2020-04-18 NOTE — Patient Instructions (Addendum)
Recommendation: - Take Flonase daily once daily  - Take Omeprazole 20mg  daily in the morning only  - Add Pepcid 20mg  at bedtime   Orders: - Labs (TSH, Free T3/4) - Pulmonary function testing re: cough   Follow-up: - Next available with Dr. Elsworth Soho or refer to Dr. Melvyn Novas for cough

## 2020-04-18 NOTE — Progress Notes (Signed)
@Patient  ID: Esau Grew, female    DOB: [birth year], 74 y.o.   MRN: [id number]  Chief Complaint  Patient presents with  . Follow-up    Referring provider: Hulan Fess, MD  HPI: 74 year old female, never smoked (second hand exposure).  Past medical history significant for HTN, hyperlipidemia, melanoma, chronic cough.  Former patient of Dr. Gwenette Greet, seen for initial consult with Dr. Elsworth Soho on February 24, 2020 for chronic cough x 3 months.    She was evaluated in our office in 9381 for similar cyclical cough which was attributed to upper airway versus reflux.  She had a CT abdomen in 2011 which showed clear lungs and no evidence of a hiatal hernia.  No significant environmental exposures. CXR with Dr. Rex Kras was clear. Cough felt to be due to postnasal drip/allergies or reflux, less likely asthma.  Trial chlorphentermine 4 mg over-the-counter at bedtime for 2 weeks. Continue Omeprazole 20mg  twice daily   Previous [facility] pulmonary encounters: 03/06/2020 Patient presents today for 1-2 week follow-up cough. Reports that she has had a cough for several months. She is no better after recommendations from Dr. Elsworth Soho. States that her cough is worse when she talks, she has some associated shortness of breath with cough. She reports dizzines when playing pickle ball. Her blood pressure medication is being adjusted by her PCP. She has seasonal allergies in spring and fall. She uses flonase as needed only. She is complaint with omeprazole twice a day. She had steriod shot with Dr. Rex Kras office which may have helped for 24 hours. She has a prescription for tesslon perles but did not filled this.  She wants to get to the bottom of why she is coughing. Family hx lung cancer. Her father smoked and sister had stage 4 lung cancer.  04/18/2020- interim hx Patient presents today for 4-6 week follow-up.  She continues to have a dry cough. She is very frustrated by this. Appears she has had similar cough dating back to 2014,  cough felt to be due to postnasal drip/allergies or reflux, less likely asthma.  She is taking omeprazole 20mg  twice daily. She is not using flonase nasal spray regularly. Trial of prednisone did not help her cough. Feels cough is coming from obstruction from her thyroid. HRCT in July showed no evidence of interstitial lung disease.  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  Groundglass 6 mm right lower lobe pulmonary nodule, recommend follow-up CT in 6 to 12 months and then every 2 years until 5 years of stability.  Incidental finding for hypodensity 2 cm left thyroid nodule.    Allergies  Allergen Reactions  . Codeine   . Sulfur     Immunization History  Administered Date(s) Administered  . Influenza Nasal 06/14/2019  . Influenza Split 05/26/2012  . PFIZER SARS-COV-2 Vaccination 09/10/2019, 10/01/2019    Past Medical History:  Diagnosis Date  . HTN (hypertension)   . Hyperlipidemia   . Melanoma ([place])    right shoulder(back)  . Seasonal allergies     Tobacco History: Social History   Tobacco Use  Smoking Status Never Smoker  Smokeless Tobacco Never Used   Counseling given: Not Answered   Outpatient Medications Prior to Visit  Medication Sig Dispense Refill  . Acetaminophen (TYLENOL ARTHRITIS PAIN PO) Take 2 tablets by mouth daily as needed.     Marland Kitchen amLODipine (NORVASC) 2.5 MG tablet Take 2.5 mg by mouth daily.    Marland Kitchen atorvastatin (LIPITOR) 10 MG tablet Take 10 mg  by mouth daily.    . fluticasone (FLONASE) 50 MCG/ACT nasal spray Place 2 sprays into the nose as needed for rhinitis.    . fluticasone (FLOVENT HFA) 44 MCG/ACT inhaler Inhale 2 puffs into the lungs in the morning and at bedtime. 1 Inhaler 12  . hydrochlorothiazide (HYDRODIURIL) 12.5 MG tablet Take 12.5 mg by mouth daily.    Marland Kitchen NEXIUM 40 MG capsule Take 80 mg by mouth daily.    Marland Kitchen omeprazole (PRILOSEC) 20 MG capsule Take 20 mg by mouth 2 (two) times daily.    . famotidine (PEPCID) 20 MG tablet Take  20 mg by mouth daily. (Patient not taking: Reported on 03/06/2020)    . predniSONE (DELTASONE) 10 MG tablet Take 4 tabs po daily x 3 days; then 3 tabs daily x3 days; then 2 tabs daily x3 days; then 1 tab daily x 3 days; then stop (Patient not taking: Reported on 04/18/2020) 30 tablet 0   No facility-administered medications prior to visit.    Review of Systems  Review of Systems  Respiratory: Positive for cough. Negative for chest tightness, shortness of breath and wheezing.    Physical Exam  BP 110/83 (BP Location: Right Arm, Cuff Size: Normal)   Pulse 85   Temp 98 F (36.7 C) (Temporal)   Ht 5' (1.524 m)   Wt 168 lb (76.2 kg)   SpO2 96%   BMI 32.81 kg/m  Physical Exam Constitutional:      Appearance: Normal appearance.  HENT:     Head: Normocephalic and atraumatic.     Mouth/Throat:     Mouth: Mucous membranes are moist.     Pharynx: Oropharynx is clear.  Neck:     Comments: Left thyroid nodules palpated Cardiovascular:     Rate and Rhythm: Normal rate and regular rhythm.  Pulmonary:     Effort: Pulmonary effort is normal.     Breath sounds: Normal breath sounds.     Comments: CTA;  throat clearing/upper airway cough  Musculoskeletal:     Cervical back: Normal range of motion and neck supple. No rigidity or tenderness.  Neurological:     General: No focal deficit present.     Mental Status: She is alert and oriented to person, place, and time. Mental status is at baseline.  Psychiatric:        Mood and Affect: Mood normal.        Behavior: Behavior normal.        Thought Content: Thought content normal.        Judgment: Judgment normal.      Lab Results:  CBC No results found for: WBC, RBC, HGB, HCT, PLT, MCV, MCH, MCHC, RDW, LYMPHSABS, MONOABS, EOSABS, BASOSABS  BMET No results found for: NA, K, CL, CO2, GLUCOSE, BUN, CREATININE, CALCIUM, GFRNONAA, GFRAA  BNP No results found for: BNP  ProBNP No results found for: PROBNP  Imaging: [place] FNA BX THYROID  1ST LESION AFIRMA  Result Date: 04/06/2020 INDICATION: Indeterminate thyroid nodule. EXAM: ULTRASOUND GUIDED FINE NEEDLE ASPIRATION OF INDETERMINATE THYROID NODULE COMPARISON:  03/29/2020 MEDICATIONS: None COMPLICATIONS: None immediate. TECHNIQUE: Informed written consent was obtained from the patient after a discussion of the risks, benefits and alternatives to treatment. Questions regarding the procedure were encouraged and answered. A timeout was performed prior to the initiation of the procedure. Pre-procedural ultrasound scanning demonstrated unchanged size and appearance of the indeterminate nodule within the left inferior thyroid lobe. The procedure was planned. The neck was prepped in the usual sterile  fashion, and a sterile drape was applied covering the operative field. A timeout was performed prior to the initiation of the procedure. Local anesthesia was provided with 1% lidocaine. Under direct ultrasound guidance, 5 fine-needle aspirations were performed with 25 gauge needles. Multiple ultrasound images were saved for procedural documentation purposes. The samples were prepared and submitted to pathology. Limited post procedural scanning was negative for hematoma or additional complication. Dressings were placed. The patient tolerated the above procedures procedure well without immediate postprocedural complication. FINDINGS: Nodule reference number based on prior diagnostic ultrasound: 2 Maximum size: 2.5 cm Location: Left; Inferior ACR TI-RADS risk category: TR3 (3 points) Reason for biopsy: meets ACR TI-RADS criteria Ultrasound imaging confirms appropriate placement of the needles within the thyroid nodule. IMPRESSION: Technically successful ultrasound guided fine needle aspiration of left inferior thyroid nodule. Electronically Signed   By: Markus Daft M.D.   On: 04/06/2020 12:30   US THYROID  Result Date: 03/29/2020 CLINICAL DATA:  Thyroid nodule on recent CT EXAM: THYROID ULTRASOUND TECHNIQUE:  Ultrasound examination of the thyroid gland and adjacent soft tissues was performed. COMPARISON:  03/16/2020 chest CT FINDINGS: Parenchymal Echotexture: Moderately heterogenous Isthmus: 4 mm Right lobe: 2.8 x 0.9 x 0.9 cm Left lobe: 4.4 x 1.8 x 1.8 cm _________________________________________________________ Estimated total number of nodules >/= 1 cm: 1 Number of spongiform nodules >/=  2 cm not described below (TR1): 0 Number of mixed cystic and solid nodules >/= 1.5 cm not described below (TR2): 0 _________________________________________________________ Nodule # 2: Location: Left; Inferior Maximum size: 2.5 cm; Other 2 dimensions: 1.8 x 1.6 cm Composition: solid/almost completely solid (2) Echogenicity: isoechoic (1) Shape: not taller-than-wide (0) Margins: ill-defined (0) Echogenic foci: none (0) ACR TI-RADS total points: 3. ACR TI-RADS risk category: TR3 (3 points). ACR TI-RADS recommendations: **Given size (>/= 2.5 cm) and appearance, fine needle aspiration of this mildly suspicious nodule should be considered based on TI-RADS criteria. _________________________________________________________ Additional subcentimeter hypoechoic and isoechoic nodules noted all measuring 7 mm or less in size. These would not meet criteria for any biopsy or follow-up and are not fully described by TI rads criteria. No hypervascularity.  No regional adenopathy. IMPRESSION: 2.5 cm left inferior TR 3 nodule meets criteria for biopsy as above. This correlates with the CT finding. The above is in keeping with the ACR TI-RADS recommendations - J Am Coll Radiol 2017;14:587-595. Electronically Signed   By: Jerilynn Mages.  Shick M.D.   On: 03/29/2020 14:06     Assessment & Plan:   Thyroid nodule - HRCT showed 2cm left thyroid nodule - TSH, Free T3,4 normal  - Refer to endocrinology   Chronic cough - Chronic cough dates back to 2014, felt to be d/t GERD vs allergies/PND. Less likely asthma. No improvement with trial prednisone. Recommend  max GERD and PND treatment  - Refer to ENT for voice hoarseness  - Checking PFTs - Patient needs to get in with Dr. Elsworth Soho, may want to consider referral to Dr. Melvyn Novas for cough    Martyn Ehrich, NP 04/19/2020

## 2020-04-19 ENCOUNTER — Encounter: Payer: Self-pay | Admitting: Primary Care

## 2020-04-19 DIAGNOSIS — E041 Nontoxic single thyroid nodule: Secondary | ICD-10-CM | POA: Insufficient documentation

## 2020-04-19 LAB — TSH: TSH: 3.91 u[IU]/mL (ref 0.35–4.50)

## 2020-04-19 LAB — T3, FREE: T3, Free: 3.3 pg/mL (ref 2.3–4.2)

## 2020-04-19 LAB — T4, FREE: Free T4: 0.92 ng/dL (ref 0.60–1.60)

## 2020-04-19 NOTE — Progress Notes (Signed)
Please let patient know thyroid levels were normal.

## 2020-04-19 NOTE — Assessment & Plan Note (Addendum)
-   HRCT showed 2cm left thyroid nodule - TSH, Free T3,4 normal  - Refer to endocrinology

## 2020-04-19 NOTE — Assessment & Plan Note (Addendum)
-   Chronic cough dates back to 2014, felt to be d/t GERD vs allergies/PND. Less likely asthma. No improvement with trial prednisone. Recommend max GERD and PND treatment  - Refer to ENT for voice hoarseness  - Checking PFTs - Patient needs to get in with Dr. Elsworth Soho, may want to consider referral to Dr. Melvyn Novas for cough

## 2020-04-19 NOTE — Progress Notes (Signed)
Yes we can refer her. Endocrinologist of thyroid nodule and ENT for voice hoarseness/cough

## 2020-04-20 ENCOUNTER — Telehealth: Payer: Self-pay | Admitting: Primary Care

## 2020-04-20 NOTE — Telephone Encounter (Signed)
Called and spoke with patient she states that she saw Beth at last visit and was told that depending on her lab work results and a few other things she would then refer her to an ENT. Patient states that she had not heard from our office and she wanted the ball rolling so she called and made an appointment herself. She has an appointment with [facility] in [place] on 05/01/20. She would like for OV notes, labs and biopsy of thyroid faxed over to their office. Fax number is [phone].  Will route to [facility] as an FYI Nothing further needed at this time.

## 2020-04-20 NOTE — Telephone Encounter (Signed)
ATC patient to confirm information [facility] -pt would thyroid notes and labs faxed to [phone]

## 2020-04-21 NOTE — Telephone Encounter (Signed)
I sent a message on lab results. She was called. Thyroid levels were normal. I asked for referral to ENT. Nothing further needed.

## 2020-05-04 ENCOUNTER — Telehealth: Payer: Self-pay | Admitting: Primary Care

## 2020-05-04 DIAGNOSIS — R05 Cough: Secondary | ICD-10-CM | POA: Diagnosis not present

## 2020-05-04 DIAGNOSIS — E041 Nontoxic single thyroid nodule: Secondary | ICD-10-CM | POA: Diagnosis not present

## 2020-05-04 NOTE — Telephone Encounter (Signed)
Results from her thyroid biopsy have been faxed to the number given

## 2020-05-15 ENCOUNTER — Ambulatory Visit (INDEPENDENT_AMBULATORY_CARE_PROVIDER_SITE_OTHER): Payer: Medicare Other | Admitting: Pulmonary Disease

## 2020-05-15 ENCOUNTER — Other Ambulatory Visit: Payer: Self-pay

## 2020-05-15 ENCOUNTER — Telehealth: Payer: Self-pay | Admitting: Adult Health

## 2020-05-15 DIAGNOSIS — R05 Cough: Secondary | ICD-10-CM | POA: Diagnosis not present

## 2020-05-15 DIAGNOSIS — R059 Cough, unspecified: Secondary | ICD-10-CM

## 2020-05-15 LAB — PULMONARY FUNCTION TEST
DL/VA % pred: 88 %
DL/VA: 3.76 ml/min/mmHg/L
DLCO cor % pred: 89 %
DLCO cor: 14.89 ml/min/mmHg
DLCO unc % pred: 89 %
DLCO unc: 14.89 ml/min/mmHg
FEF 25-75 Post: 0.85 L/sec
FEF 25-75 Pre: 1.66 L/sec
FEF2575-%Change-Post: -48 %
FEF2575-%Pred-Post: 59 %
FEF2575-%Pred-Pre: 115 %
FEV1-%Change-Post: -9 %
FEV1-%Pred-Post: 96 %
FEV1-%Pred-Pre: 107 %
FEV1-Post: 1.69 L
FEV1-Pre: 1.87 L
FEV1FVC-%Change-Post: 11 %
FEV1FVC-%Pred-Pre: 104 %
FEV6-%Change-Post: -18 %
FEV6-%Pred-Post: 88 %
FEV6-%Pred-Pre: 108 %
FEV6-Post: 1.95 L
FEV6-Pre: 2.4 L
FEV6FVC-%Change-Post: 0 %
FEV6FVC-%Pred-Post: 105 %
FEV6FVC-%Pred-Pre: 105 %
FVC-%Change-Post: -18 %
FVC-%Pred-Post: 83 %
FVC-%Pred-Pre: 102 %
FVC-Post: 1.95 L
FVC-Pre: 2.41 L
Post FEV1/FVC ratio: 87 %
Post FEV6/FVC ratio: 100 %
Pre FEV1/FVC ratio: 78 %
Pre FEV6/FVC Ratio: 100 %
RV % pred: 94 %
RV: 1.97 L
TLC % pred: 97 %
TLC: 4.34 L

## 2020-05-15 NOTE — Progress Notes (Signed)
PFT done today. 

## 2020-05-15 NOTE — Telephone Encounter (Signed)
Called to PFT , patient with increased respirations , anxiety . ? Hyperventilation.  Feels tingly all over.  HR ~ 80, regular , O2 Sats 100% on room air.  Dr. Annamaria Boots  To room with evaluation.  Patient support provided. Patient w/ ongoing anxiety and complaints of feeling weak PFT reviewed with no airflow obstruction noted.   .  Recommend EMS transport to ER for further evaluation . Patient declined .  Called family to pick her up . Left in stable conditon per [place]. Improved.   Please contact office for sooner follow up if symptoms do not improve or worsen or seek emergency care

## 2020-05-16 DIAGNOSIS — R221 Localized swelling, mass and lump, neck: Secondary | ICD-10-CM | POA: Diagnosis not present

## 2020-05-16 DIAGNOSIS — E041 Nontoxic single thyroid nodule: Secondary | ICD-10-CM | POA: Diagnosis not present

## 2020-05-16 NOTE — Telephone Encounter (Signed)
Agree with NP assessment. Patient appeared to be hyperventilating, possibly related to close space of plethysmograph or stimulus of bronchodilator given for testing.

## 2020-05-18 NOTE — Progress Notes (Signed)
PFTs showed no evidence of COPD. Mild restriction without bronchodilator response. I needed her to follow up with Dr. Elsworth Soho, if he doesn't have anything I was her referred to Dr. Melvyn Novas for cough

## 2020-05-19 ENCOUNTER — Telehealth: Payer: Self-pay | Admitting: Primary Care

## 2020-05-19 NOTE — Telephone Encounter (Signed)
PFTs showed no evidence of COPD. Mild restriction without bronchodilator response. I needed her to follow up with Dr. Elsworth Soho, if he doesn't have anything I was her referred to Dr. Melvyn Novas for cough  Spoke with the pt and notified of PFT results and she verbalized understanding  Offered appt with Dr Elsworth Soho 06/08/20  She refused and states would like to be seen later in the month or the following month  There is already a recall in place for appt

## 2020-05-19 NOTE — Progress Notes (Signed)
Pt notified of results

## 2020-05-25 DIAGNOSIS — Z23 Encounter for immunization: Secondary | ICD-10-CM | POA: Diagnosis not present

## 2020-05-31 DIAGNOSIS — R112 Nausea with vomiting, unspecified: Secondary | ICD-10-CM | POA: Diagnosis not present

## 2020-05-31 DIAGNOSIS — E041 Nontoxic single thyroid nodule: Secondary | ICD-10-CM | POA: Diagnosis not present

## 2020-06-05 DIAGNOSIS — Z888 Allergy status to other drugs, medicaments and biological substances status: Secondary | ICD-10-CM | POA: Diagnosis not present

## 2020-06-05 DIAGNOSIS — K219 Gastro-esophageal reflux disease without esophagitis: Secondary | ICD-10-CM | POA: Diagnosis not present

## 2020-06-05 DIAGNOSIS — N189 Chronic kidney disease, unspecified: Secondary | ICD-10-CM | POA: Diagnosis not present

## 2020-06-05 DIAGNOSIS — R053 Chronic cough: Secondary | ICD-10-CM | POA: Diagnosis not present

## 2020-06-05 DIAGNOSIS — E041 Nontoxic single thyroid nodule: Secondary | ICD-10-CM | POA: Diagnosis not present

## 2020-06-05 DIAGNOSIS — Z79899 Other long term (current) drug therapy: Secondary | ICD-10-CM | POA: Diagnosis not present

## 2020-06-05 DIAGNOSIS — E042 Nontoxic multinodular goiter: Secondary | ICD-10-CM | POA: Diagnosis not present

## 2020-06-05 DIAGNOSIS — I129 Hypertensive chronic kidney disease with stage 1 through stage 4 chronic kidney disease, or unspecified chronic kidney disease: Secondary | ICD-10-CM | POA: Diagnosis not present

## 2020-06-05 DIAGNOSIS — Z9049 Acquired absence of other specified parts of digestive tract: Secondary | ICD-10-CM | POA: Diagnosis not present

## 2020-06-05 DIAGNOSIS — Z885 Allergy status to narcotic agent status: Secondary | ICD-10-CM | POA: Diagnosis not present

## 2020-06-05 DIAGNOSIS — Z85820 Personal history of malignant melanoma of skin: Secondary | ICD-10-CM | POA: Diagnosis not present

## 2020-06-05 DIAGNOSIS — Z91048 Other nonmedicinal substance allergy status: Secondary | ICD-10-CM | POA: Diagnosis not present

## 2020-06-05 DIAGNOSIS — E785 Hyperlipidemia, unspecified: Secondary | ICD-10-CM | POA: Diagnosis not present

## 2020-06-09 DIAGNOSIS — R300 Dysuria: Secondary | ICD-10-CM | POA: Diagnosis not present

## 2020-06-19 DIAGNOSIS — E079 Disorder of thyroid, unspecified: Secondary | ICD-10-CM | POA: Diagnosis not present

## 2020-06-20 DIAGNOSIS — L570 Actinic keratosis: Secondary | ICD-10-CM | POA: Diagnosis not present

## 2020-06-20 DIAGNOSIS — Z86008 Personal history of in-situ neoplasm of other site: Secondary | ICD-10-CM | POA: Diagnosis not present

## 2020-06-20 DIAGNOSIS — Z85820 Personal history of malignant melanoma of skin: Secondary | ICD-10-CM | POA: Diagnosis not present

## 2020-06-20 DIAGNOSIS — Z85828 Personal history of other malignant neoplasm of skin: Secondary | ICD-10-CM | POA: Diagnosis not present

## 2020-06-27 ENCOUNTER — Other Ambulatory Visit: Payer: Self-pay | Admitting: Family Medicine

## 2020-06-27 DIAGNOSIS — Z1231 Encounter for screening mammogram for malignant neoplasm of breast: Secondary | ICD-10-CM

## 2020-07-17 DIAGNOSIS — E079 Disorder of thyroid, unspecified: Secondary | ICD-10-CM | POA: Diagnosis not present

## 2020-08-08 DIAGNOSIS — L570 Actinic keratosis: Secondary | ICD-10-CM | POA: Diagnosis not present

## 2020-08-08 DIAGNOSIS — D485 Neoplasm of uncertain behavior of skin: Secondary | ICD-10-CM | POA: Diagnosis not present

## 2020-08-08 DIAGNOSIS — L309 Dermatitis, unspecified: Secondary | ICD-10-CM | POA: Diagnosis not present

## 2020-08-08 DIAGNOSIS — C44619 Basal cell carcinoma of skin of left upper limb, including shoulder: Secondary | ICD-10-CM | POA: Diagnosis not present

## 2020-08-11 ENCOUNTER — Ambulatory Visit
Admission: RE | Admit: 2020-08-11 | Discharge: 2020-08-11 | Disposition: A | Discharge status: Home / Self Care | Payer: Medicare Other | Source: Ambulatory Visit | Attending: Family Medicine | Admitting: Family Medicine

## 2020-08-11 ENCOUNTER — Other Ambulatory Visit: Payer: Self-pay

## 2020-08-11 DIAGNOSIS — Z1231 Encounter for screening mammogram for malignant neoplasm of breast: Secondary | ICD-10-CM | POA: Diagnosis not present

## 2020-09-04 ENCOUNTER — Other Ambulatory Visit (HOSPITAL_COMMUNITY): Payer: Self-pay | Admitting: Family Medicine

## 2020-09-04 ENCOUNTER — Other Ambulatory Visit: Payer: Self-pay | Admitting: Family Medicine

## 2020-09-04 DIAGNOSIS — R911 Solitary pulmonary nodule: Secondary | ICD-10-CM

## 2020-09-21 DIAGNOSIS — E079 Disorder of thyroid, unspecified: Secondary | ICD-10-CM | POA: Diagnosis not present

## 2020-09-25 ENCOUNTER — Other Ambulatory Visit: Payer: Self-pay

## 2020-09-25 ENCOUNTER — Ambulatory Visit (INDEPENDENT_AMBULATORY_CARE_PROVIDER_SITE_OTHER): Payer: Medicare Other

## 2020-09-25 DIAGNOSIS — J984 Other disorders of lung: Secondary | ICD-10-CM | POA: Diagnosis not present

## 2020-09-25 DIAGNOSIS — J9811 Atelectasis: Secondary | ICD-10-CM | POA: Diagnosis not present

## 2020-09-25 DIAGNOSIS — J432 Centrilobular emphysema: Secondary | ICD-10-CM | POA: Diagnosis not present

## 2020-09-25 DIAGNOSIS — R911 Solitary pulmonary nodule: Secondary | ICD-10-CM | POA: Diagnosis not present

## 2020-09-25 DIAGNOSIS — M47814 Spondylosis without myelopathy or radiculopathy, thoracic region: Secondary | ICD-10-CM | POA: Diagnosis not present

## 2020-10-31 DIAGNOSIS — Z85820 Personal history of malignant melanoma of skin: Secondary | ICD-10-CM | POA: Diagnosis not present

## 2020-10-31 DIAGNOSIS — L578 Other skin changes due to chronic exposure to nonionizing radiation: Secondary | ICD-10-CM | POA: Diagnosis not present

## 2020-10-31 DIAGNOSIS — Z85828 Personal history of other malignant neoplasm of skin: Secondary | ICD-10-CM | POA: Diagnosis not present

## 2020-10-31 DIAGNOSIS — L820 Inflamed seborrheic keratosis: Secondary | ICD-10-CM | POA: Diagnosis not present

## 2021-01-02 DIAGNOSIS — D485 Neoplasm of uncertain behavior of skin: Secondary | ICD-10-CM | POA: Diagnosis not present

## 2021-01-02 DIAGNOSIS — D0471 Carcinoma in situ of skin of right lower limb, including hip: Secondary | ICD-10-CM | POA: Diagnosis not present

## 2021-01-08 DIAGNOSIS — D0471 Carcinoma in situ of skin of right lower limb, including hip: Secondary | ICD-10-CM | POA: Diagnosis not present

## 2021-01-08 DIAGNOSIS — L570 Actinic keratosis: Secondary | ICD-10-CM | POA: Diagnosis not present

## 2021-01-10 DIAGNOSIS — Z85828 Personal history of other malignant neoplasm of skin: Secondary | ICD-10-CM | POA: Diagnosis not present

## 2021-01-10 DIAGNOSIS — Z8589 Personal history of malignant neoplasm of other organs and systems: Secondary | ICD-10-CM | POA: Diagnosis not present

## 2021-01-10 DIAGNOSIS — Z8579 Personal history of other malignant neoplasms of lymphoid, hematopoietic and related tissues: Secondary | ICD-10-CM | POA: Diagnosis not present

## 2021-01-10 DIAGNOSIS — J302 Other seasonal allergic rhinitis: Secondary | ICD-10-CM | POA: Diagnosis not present

## 2021-01-10 DIAGNOSIS — R911 Solitary pulmonary nodule: Secondary | ICD-10-CM | POA: Diagnosis not present

## 2021-01-10 DIAGNOSIS — E782 Mixed hyperlipidemia: Secondary | ICD-10-CM | POA: Diagnosis not present

## 2021-01-10 DIAGNOSIS — I10 Essential (primary) hypertension: Secondary | ICD-10-CM | POA: Diagnosis not present

## 2021-01-10 DIAGNOSIS — E041 Nontoxic single thyroid nodule: Secondary | ICD-10-CM | POA: Diagnosis not present

## 2021-01-10 DIAGNOSIS — K219 Gastro-esophageal reflux disease without esophagitis: Secondary | ICD-10-CM | POA: Diagnosis not present

## 2021-02-13 DIAGNOSIS — Z23 Encounter for immunization: Secondary | ICD-10-CM | POA: Diagnosis not present

## 2021-02-19 DIAGNOSIS — M25559 Pain in unspecified hip: Secondary | ICD-10-CM | POA: Diagnosis not present

## 2021-03-14 DIAGNOSIS — M7062 Trochanteric bursitis, left hip: Secondary | ICD-10-CM | POA: Diagnosis not present

## 2021-03-14 DIAGNOSIS — M66852 Spontaneous rupture of other tendons, left thigh: Secondary | ICD-10-CM | POA: Diagnosis not present

## 2021-03-14 DIAGNOSIS — M24852 Other specific joint derangements of left hip, not elsewhere classified: Secondary | ICD-10-CM | POA: Diagnosis not present

## 2021-03-14 DIAGNOSIS — M1612 Unilateral primary osteoarthritis, left hip: Secondary | ICD-10-CM | POA: Diagnosis not present

## 2021-03-14 DIAGNOSIS — M25552 Pain in left hip: Secondary | ICD-10-CM | POA: Diagnosis not present

## 2021-03-15 DIAGNOSIS — R93421 Abnormal radiologic findings on diagnostic imaging of right kidney: Secondary | ICD-10-CM | POA: Diagnosis not present

## 2021-03-15 DIAGNOSIS — R399 Unspecified symptoms and signs involving the genitourinary system: Secondary | ICD-10-CM | POA: Diagnosis not present

## 2021-03-15 DIAGNOSIS — R911 Solitary pulmonary nodule: Secondary | ICD-10-CM | POA: Diagnosis not present

## 2021-03-15 DIAGNOSIS — Z780 Asymptomatic menopausal state: Secondary | ICD-10-CM | POA: Diagnosis not present

## 2021-03-15 DIAGNOSIS — N289 Disorder of kidney and ureter, unspecified: Secondary | ICD-10-CM | POA: Diagnosis not present

## 2021-03-15 DIAGNOSIS — I10 Essential (primary) hypertension: Secondary | ICD-10-CM | POA: Diagnosis not present

## 2021-03-15 DIAGNOSIS — E782 Mixed hyperlipidemia: Secondary | ICD-10-CM | POA: Diagnosis not present

## 2021-03-15 DIAGNOSIS — Z0000 Encounter for general adult medical examination without abnormal findings: Secondary | ICD-10-CM | POA: Diagnosis not present

## 2021-03-15 DIAGNOSIS — E039 Hypothyroidism, unspecified: Secondary | ICD-10-CM | POA: Diagnosis not present

## 2021-03-21 DIAGNOSIS — N289 Disorder of kidney and ureter, unspecified: Secondary | ICD-10-CM | POA: Diagnosis not present

## 2021-03-21 DIAGNOSIS — R399 Unspecified symptoms and signs involving the genitourinary system: Secondary | ICD-10-CM | POA: Diagnosis not present

## 2021-03-21 DIAGNOSIS — I10 Essential (primary) hypertension: Secondary | ICD-10-CM | POA: Diagnosis not present

## 2021-03-21 DIAGNOSIS — R93421 Abnormal radiologic findings on diagnostic imaging of right kidney: Secondary | ICD-10-CM | POA: Diagnosis not present

## 2021-03-21 DIAGNOSIS — N281 Cyst of kidney, acquired: Secondary | ICD-10-CM | POA: Diagnosis not present

## 2021-03-21 DIAGNOSIS — Z780 Asymptomatic menopausal state: Secondary | ICD-10-CM | POA: Diagnosis not present

## 2021-03-27 DIAGNOSIS — M25559 Pain in unspecified hip: Secondary | ICD-10-CM | POA: Diagnosis not present

## 2021-04-21 DIAGNOSIS — Z20822 Contact with and (suspected) exposure to covid-19: Secondary | ICD-10-CM | POA: Diagnosis not present

## 2021-05-08 DIAGNOSIS — Z85820 Personal history of malignant melanoma of skin: Secondary | ICD-10-CM | POA: Diagnosis not present

## 2021-05-08 DIAGNOSIS — L570 Actinic keratosis: Secondary | ICD-10-CM | POA: Diagnosis not present

## 2021-05-08 DIAGNOSIS — Z86008 Personal history of in-situ neoplasm of other site: Secondary | ICD-10-CM | POA: Diagnosis not present

## 2021-05-08 DIAGNOSIS — L820 Inflamed seborrheic keratosis: Secondary | ICD-10-CM | POA: Diagnosis not present

## 2021-05-29 DIAGNOSIS — Z23 Encounter for immunization: Secondary | ICD-10-CM | POA: Diagnosis not present

## 2021-07-02 ENCOUNTER — Other Ambulatory Visit: Payer: Self-pay | Admitting: Internal Medicine

## 2021-07-02 DIAGNOSIS — Z1231 Encounter for screening mammogram for malignant neoplasm of breast: Secondary | ICD-10-CM

## 2021-07-09 DIAGNOSIS — H5203 Hypermetropia, bilateral: Secondary | ICD-10-CM | POA: Diagnosis not present

## 2021-07-09 DIAGNOSIS — H353131 Nonexudative age-related macular degeneration, bilateral, early dry stage: Secondary | ICD-10-CM | POA: Diagnosis not present

## 2021-07-09 DIAGNOSIS — H25813 Combined forms of age-related cataract, bilateral: Secondary | ICD-10-CM | POA: Diagnosis not present

## 2021-08-01 DIAGNOSIS — H527 Unspecified disorder of refraction: Secondary | ICD-10-CM | POA: Diagnosis not present

## 2021-08-01 DIAGNOSIS — H52203 Unspecified astigmatism, bilateral: Secondary | ICD-10-CM | POA: Diagnosis not present

## 2021-08-01 DIAGNOSIS — H40003 Preglaucoma, unspecified, bilateral: Secondary | ICD-10-CM | POA: Diagnosis not present

## 2021-08-01 DIAGNOSIS — H02834 Dermatochalasis of left upper eyelid: Secondary | ICD-10-CM | POA: Diagnosis not present

## 2021-08-01 DIAGNOSIS — H25813 Combined forms of age-related cataract, bilateral: Secondary | ICD-10-CM | POA: Diagnosis not present

## 2021-08-01 DIAGNOSIS — H353131 Nonexudative age-related macular degeneration, bilateral, early dry stage: Secondary | ICD-10-CM | POA: Diagnosis not present

## 2021-08-01 DIAGNOSIS — H02831 Dermatochalasis of right upper eyelid: Secondary | ICD-10-CM | POA: Diagnosis not present

## 2021-08-07 DIAGNOSIS — I129 Hypertensive chronic kidney disease with stage 1 through stage 4 chronic kidney disease, or unspecified chronic kidney disease: Secondary | ICD-10-CM | POA: Diagnosis not present

## 2021-08-07 DIAGNOSIS — Z90710 Acquired absence of both cervix and uterus: Secondary | ICD-10-CM | POA: Diagnosis not present

## 2021-08-07 DIAGNOSIS — H25812 Combined forms of age-related cataract, left eye: Secondary | ICD-10-CM | POA: Diagnosis not present

## 2021-08-07 DIAGNOSIS — K219 Gastro-esophageal reflux disease without esophagitis: Secondary | ICD-10-CM | POA: Diagnosis not present

## 2021-08-07 DIAGNOSIS — Z882 Allergy status to sulfonamides status: Secondary | ICD-10-CM | POA: Diagnosis not present

## 2021-08-07 DIAGNOSIS — N189 Chronic kidney disease, unspecified: Secondary | ICD-10-CM | POA: Diagnosis not present

## 2021-08-07 DIAGNOSIS — Z9049 Acquired absence of other specified parts of digestive tract: Secondary | ICD-10-CM | POA: Diagnosis not present

## 2021-08-07 DIAGNOSIS — H25813 Combined forms of age-related cataract, bilateral: Secondary | ICD-10-CM | POA: Diagnosis not present

## 2021-08-07 DIAGNOSIS — Z79899 Other long term (current) drug therapy: Secondary | ICD-10-CM | POA: Diagnosis not present

## 2021-08-07 DIAGNOSIS — E782 Mixed hyperlipidemia: Secondary | ICD-10-CM | POA: Diagnosis not present

## 2021-08-07 DIAGNOSIS — E039 Hypothyroidism, unspecified: Secondary | ICD-10-CM | POA: Diagnosis not present

## 2021-08-07 DIAGNOSIS — Z888 Allergy status to other drugs, medicaments and biological substances status: Secondary | ICD-10-CM | POA: Diagnosis not present

## 2021-08-07 DIAGNOSIS — Z79890 Hormone replacement therapy (postmenopausal): Secondary | ICD-10-CM | POA: Diagnosis not present

## 2021-08-13 ENCOUNTER — Other Ambulatory Visit: Payer: Self-pay

## 2021-08-13 ENCOUNTER — Ambulatory Visit
Admission: RE | Admit: 2021-08-13 | Discharge: 2021-08-13 | Disposition: A | Discharge status: Home / Self Care | Payer: Medicare Other | Source: Ambulatory Visit | Attending: Internal Medicine | Admitting: Internal Medicine

## 2021-08-13 DIAGNOSIS — Z1231 Encounter for screening mammogram for malignant neoplasm of breast: Secondary | ICD-10-CM | POA: Diagnosis not present

## 2021-08-14 DIAGNOSIS — M1990 Unspecified osteoarthritis, unspecified site: Secondary | ICD-10-CM | POA: Diagnosis not present

## 2021-08-14 DIAGNOSIS — K219 Gastro-esophageal reflux disease without esophagitis: Secondary | ICD-10-CM | POA: Diagnosis not present

## 2021-08-14 DIAGNOSIS — Z9049 Acquired absence of other specified parts of digestive tract: Secondary | ICD-10-CM | POA: Diagnosis not present

## 2021-08-14 DIAGNOSIS — Z888 Allergy status to other drugs, medicaments and biological substances status: Secondary | ICD-10-CM | POA: Diagnosis not present

## 2021-08-14 DIAGNOSIS — Z90710 Acquired absence of both cervix and uterus: Secondary | ICD-10-CM | POA: Diagnosis not present

## 2021-08-14 DIAGNOSIS — E039 Hypothyroidism, unspecified: Secondary | ICD-10-CM | POA: Diagnosis not present

## 2021-08-14 DIAGNOSIS — Z79890 Hormone replacement therapy (postmenopausal): Secondary | ICD-10-CM | POA: Diagnosis not present

## 2021-08-14 DIAGNOSIS — Z882 Allergy status to sulfonamides status: Secondary | ICD-10-CM | POA: Diagnosis not present

## 2021-08-14 DIAGNOSIS — H25813 Combined forms of age-related cataract, bilateral: Secondary | ICD-10-CM | POA: Diagnosis not present

## 2021-08-14 DIAGNOSIS — I10 Essential (primary) hypertension: Secondary | ICD-10-CM | POA: Diagnosis not present

## 2021-08-14 DIAGNOSIS — E782 Mixed hyperlipidemia: Secondary | ICD-10-CM | POA: Diagnosis not present

## 2021-08-14 DIAGNOSIS — H25811 Combined forms of age-related cataract, right eye: Secondary | ICD-10-CM | POA: Diagnosis not present

## 2021-08-14 DIAGNOSIS — Z79899 Other long term (current) drug therapy: Secondary | ICD-10-CM | POA: Diagnosis not present

## 2022-07-02 IMAGING — US US THYROID
1 series · 13 of 25 positions shown · non-contrast
Comparison: 03/16/2020 chest CT

CLINICAL DATA: Thyroid nodule on recent CT

EXAM:
THYROID ULTRASOUND
TECHNIQUE: Ultrasound examination of the thyroid gland and adjacent soft
tissues was performed.

[Series 1: us thyroid · 0.05mm/px · 13 of 45 slices shown]
[im 1/45]
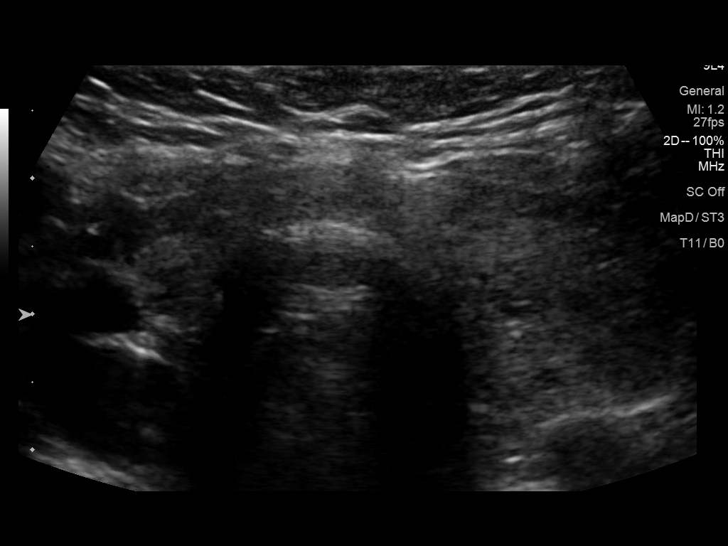
[im 4/45]
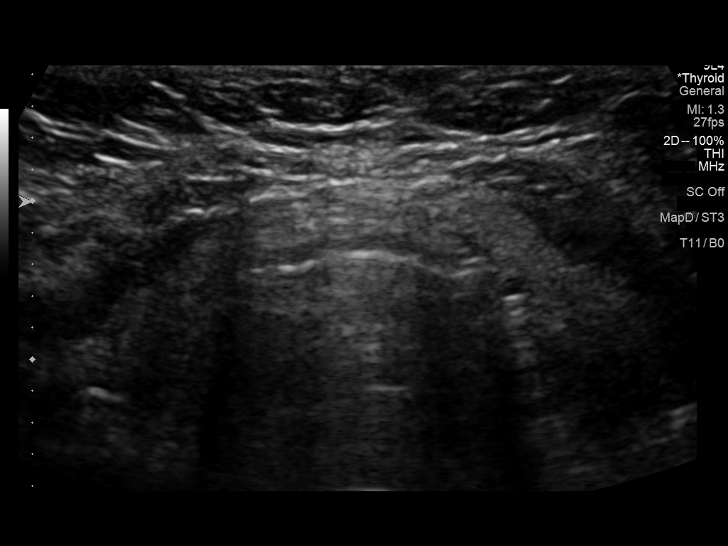
[im 8/45]
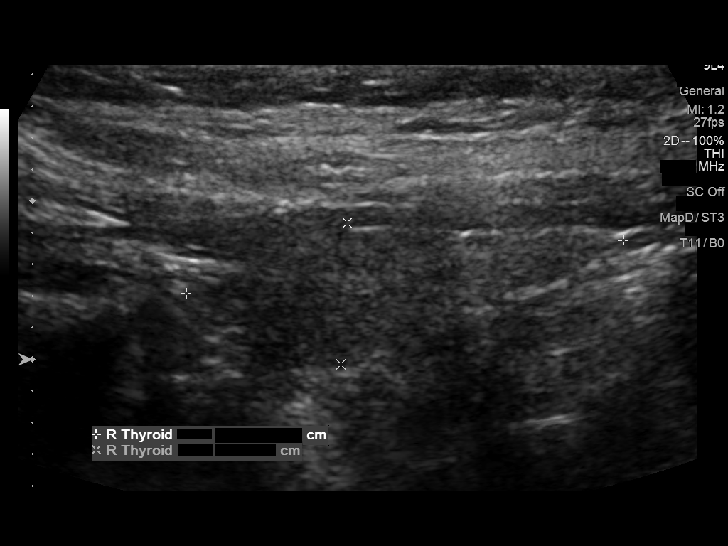
[im 12/45]
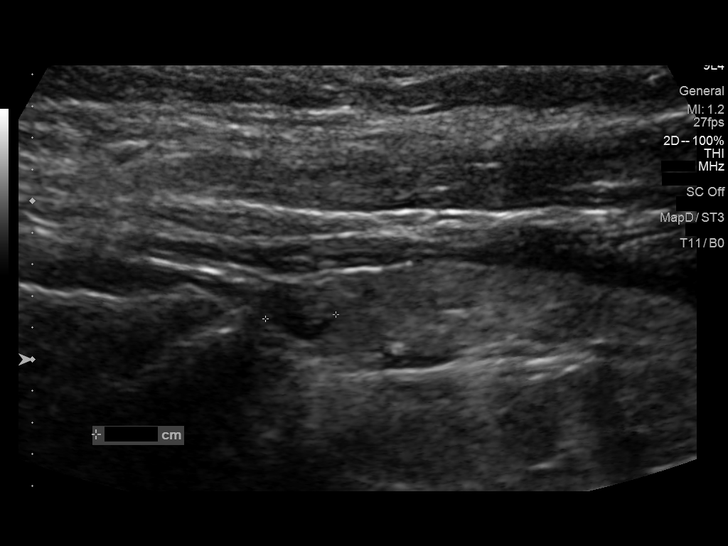
[im 15/45]
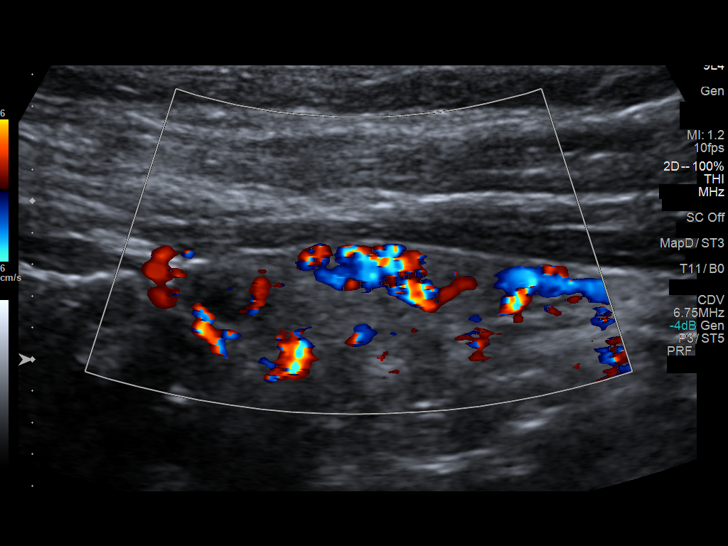
[im 19/45]
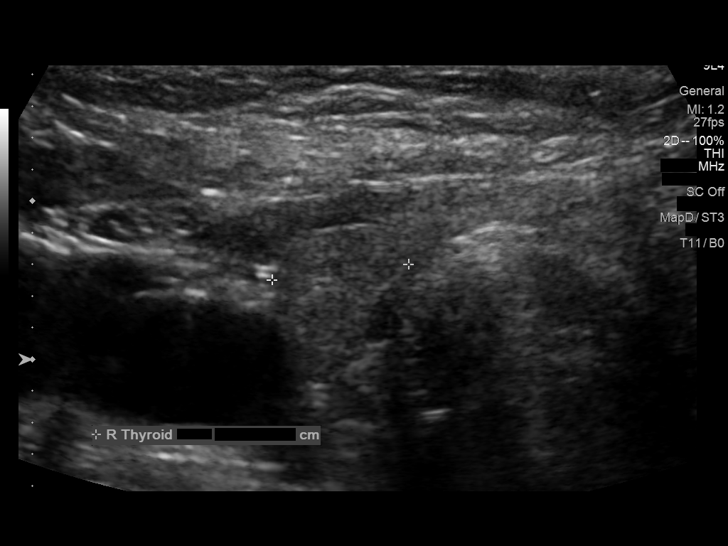
[im 23/45]
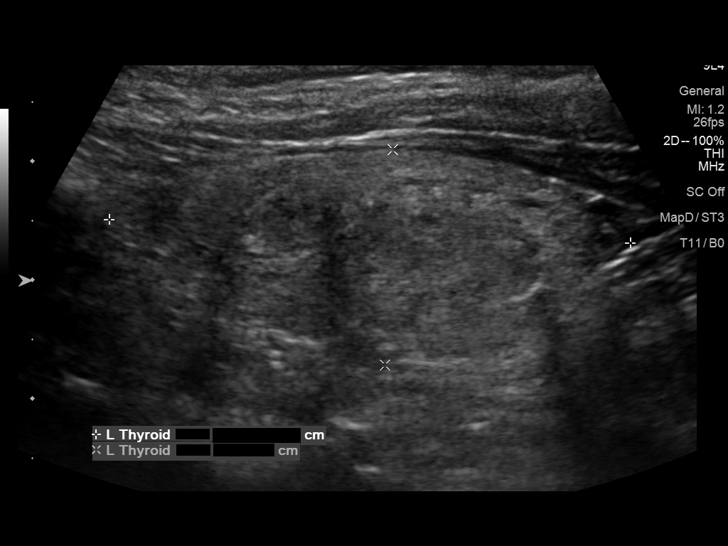
[im 26/45]
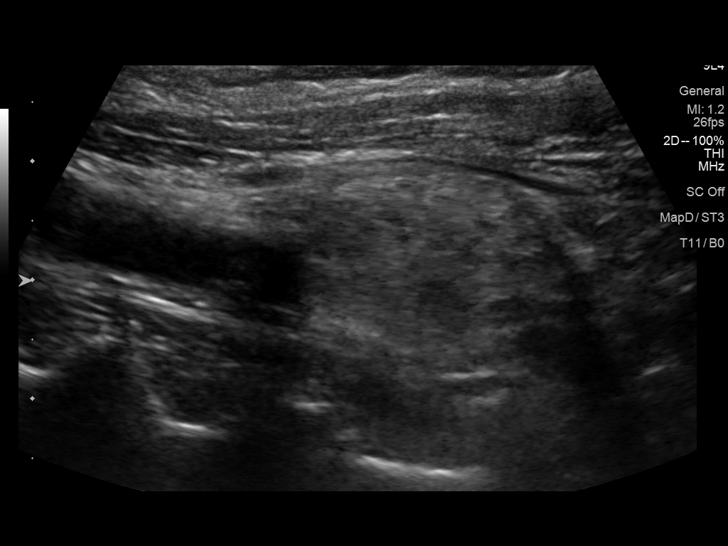
[im 30/45]
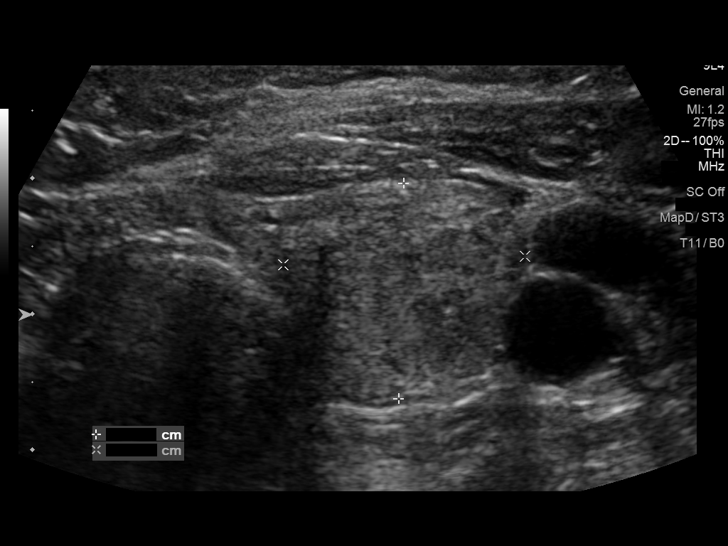
[im 34/45]
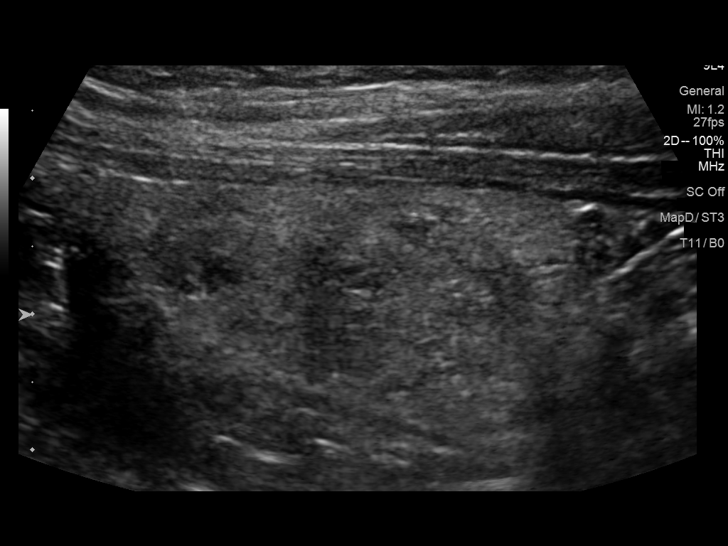
[im 37/45]
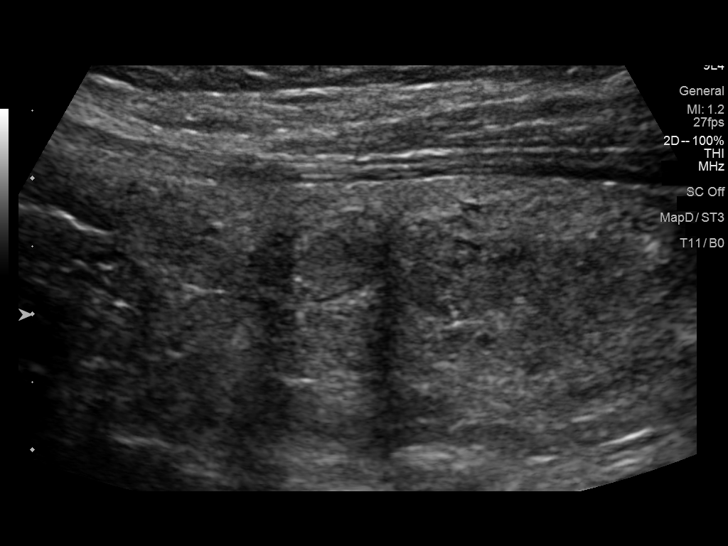
[im 41/45]
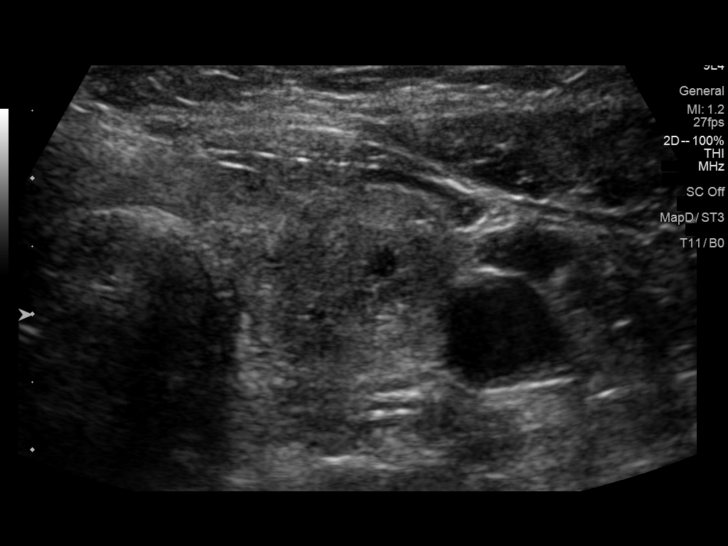
[im 45/45]
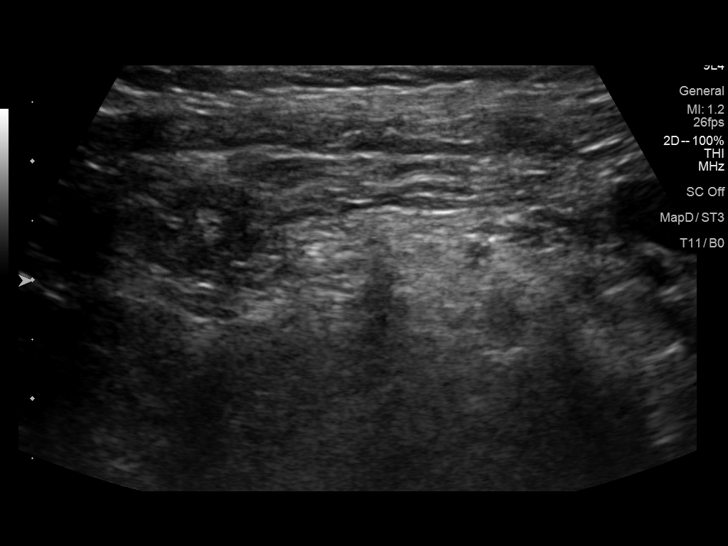

[13 of 25 positions shown; findings below may reference images not displayed]

FINDINGS: Parenchymal Echotexture: Moderately heterogenous

Isthmus: 4 mm

Right lobe: 2.8 x 0.9 x 0.9 cm

Left lobe: 4.4 x 1.8 x 1.8 cm

_________________________________________________________

Estimated total number of nodules >/= 1 cm: 1

Number of spongiform nodules >/=  2 cm not described below (TR1): 0

Number of mixed cystic and solid nodules >/= 1.5 cm not described
below (TR2): 0

_________________________________________________________

Nodule # 2:

Location: Left; Inferior

Maximum size: 2.5 cm; Other 2 dimensions: 1.8 x 1.6 cm

Composition: solid/almost completely solid (2)

Echogenicity: isoechoic (1)

Shape: not taller-than-wide (0)

Margins: ill-defined (0)

Echogenic foci: none (0)

ACR TI-RADS total points: 3.

ACR TI-RADS risk category: TR3 (3 points).

ACR TI-RADS recommendations:

**Given size (>/= 2.5 cm) and appearance, fine needle aspiration of
this mildly suspicious nodule should be considered based on TI-RADS
criteria.

_________________________________________________________

Additional subcentimeter hypoechoic and isoechoic nodules noted all
measuring 7 mm or less in size. These would not meet criteria for
any biopsy or follow-up and are not fully described by TI rads
criteria.

No hypervascularity.  No regional adenopathy.
IMPRESSION: 2.5 cm left inferior TR 3 nodule meets criteria for biopsy as above.
This correlates with the CT finding.

The above is in keeping with the ACR TI-RADS recommendations - [HOSPITAL] 9844;[DATE].

## 2022-07-10 IMAGING — US US FNA BIOPSY THYROID 1ST LESION
1 series · 13 of 18 positions shown · non-contrast
Comparison: 03/29/2020

MEDICATIONS:
None

COMPLICATIONS:
None immediate.

INDICATION: Indeterminate thyroid nodule.

EXAM:
ULTRASOUND GUIDED FINE NEEDLE ASPIRATION OF INDETERMINATE THYROID
NODULE
TECHNIQUE: Informed written consent was obtained from the patient after a
discussion of the risks, benefits and alternatives to treatment.
Questions regarding the procedure were encouraged and answered. A
timeout was performed prior to the initiation of the procedure.

[Series 1: us fna biopsy thyroid 1st lesion · 0.06mm/px · 18 acquisitions, 13 frames shown]
[im 1/18]
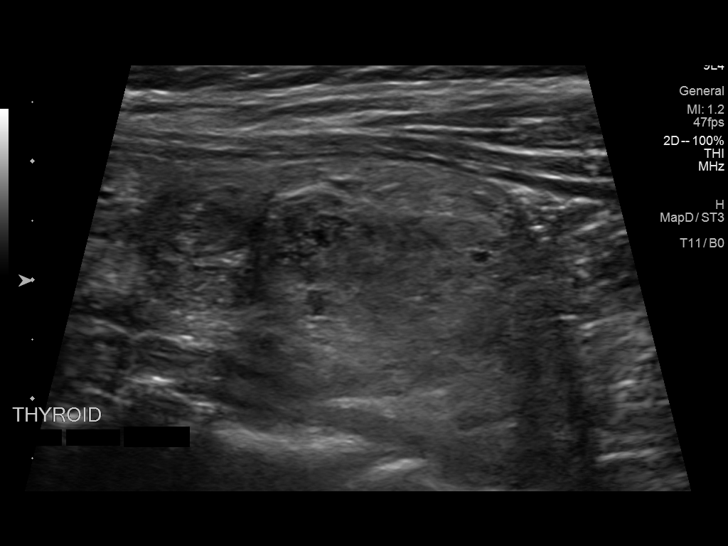
[im 3/18]
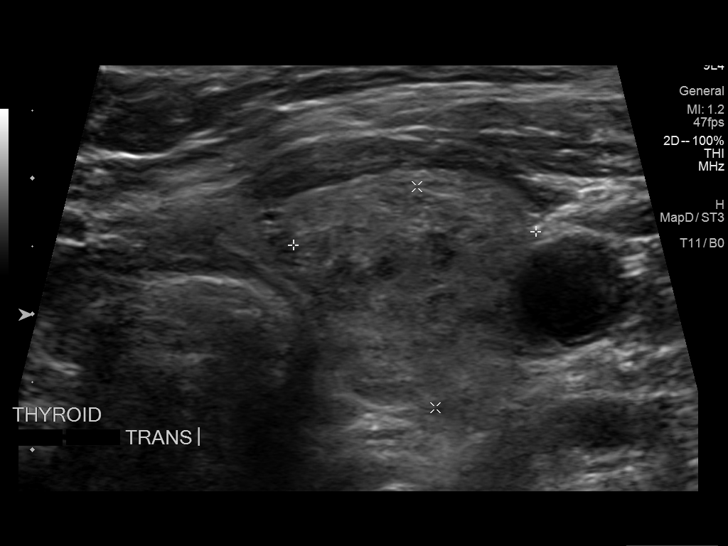
[im 4/18]
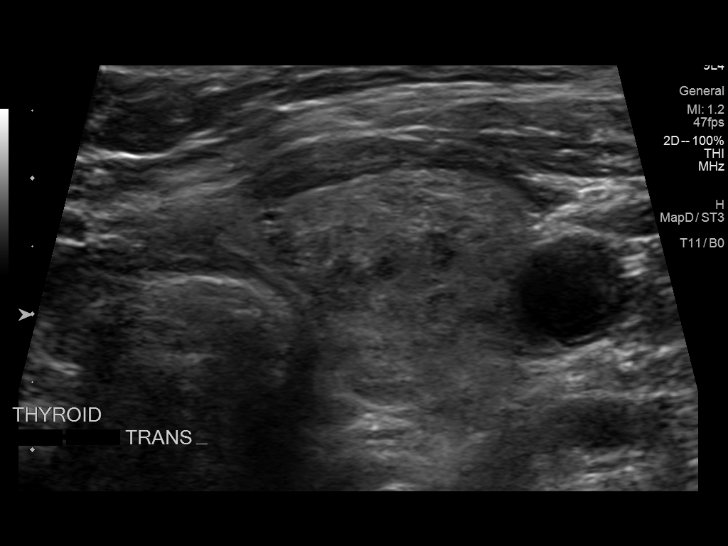
[im 5/18]
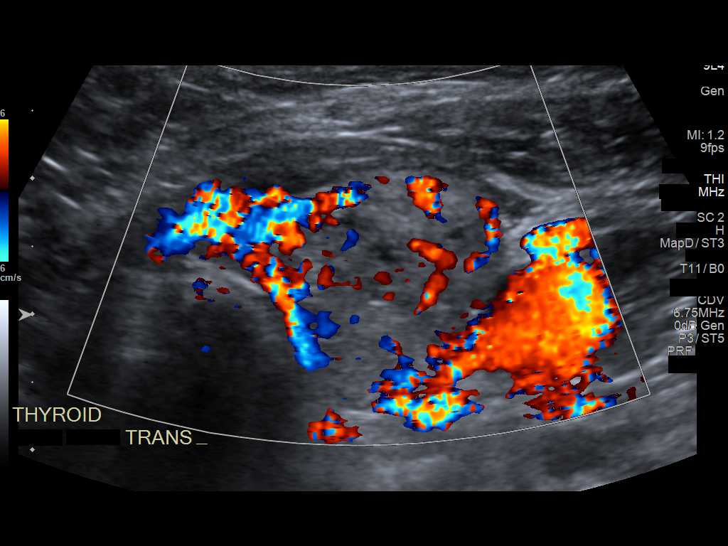
[im 7/18]
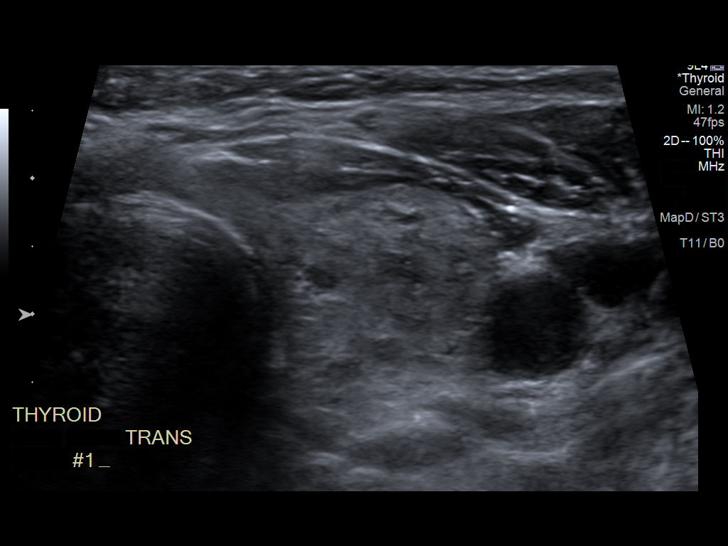
[im 8/18]
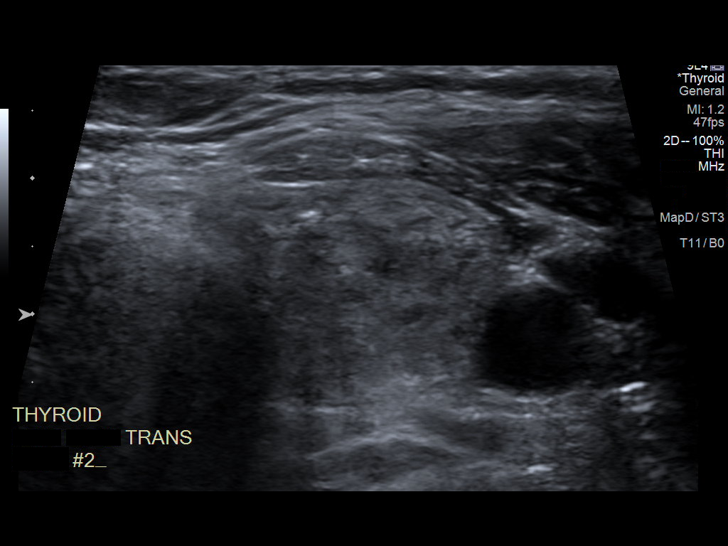
[im 10/18]
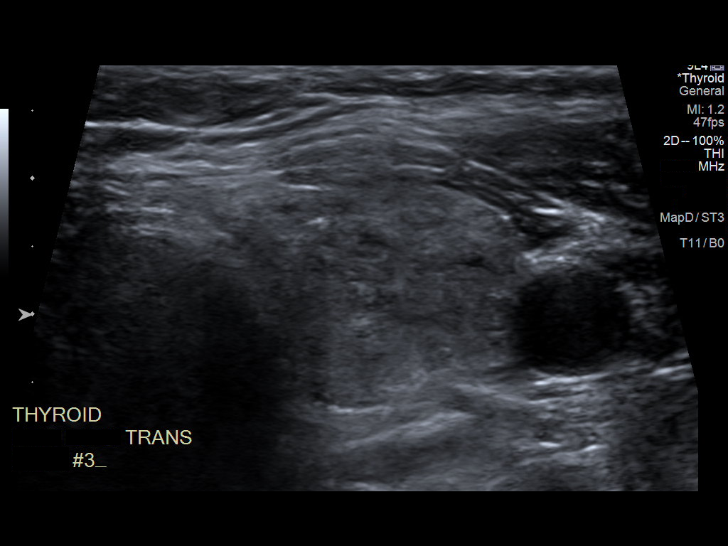
[im 11/18]
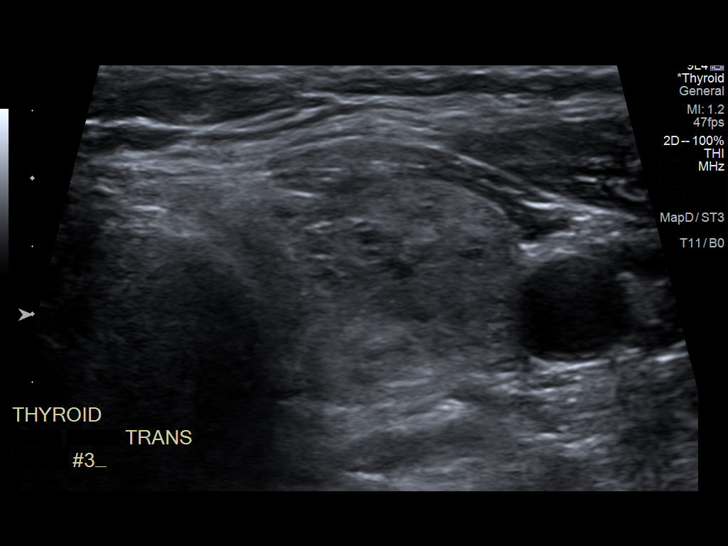
[im 12/18]
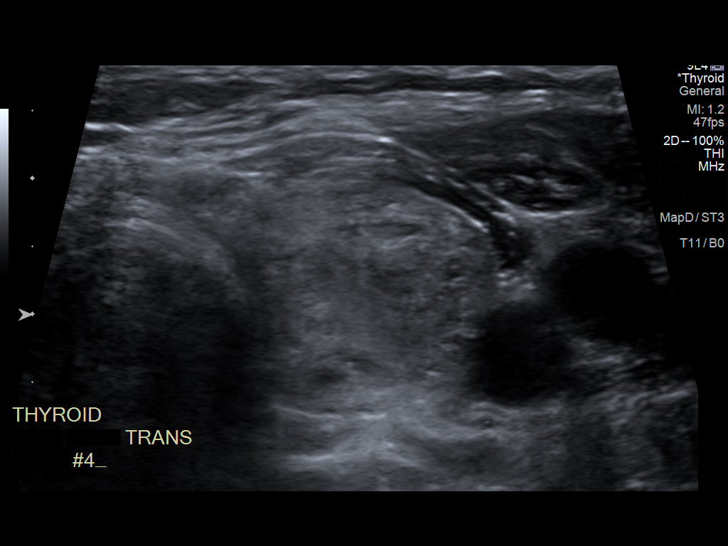
[im 14/18]
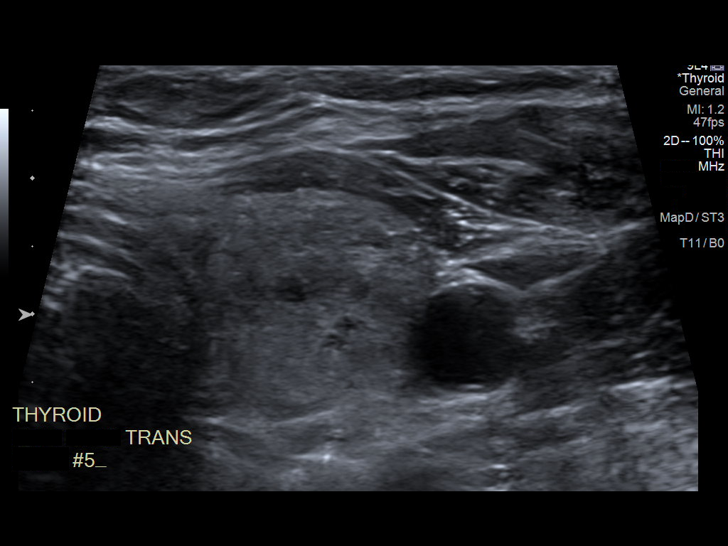
[im 15/18]
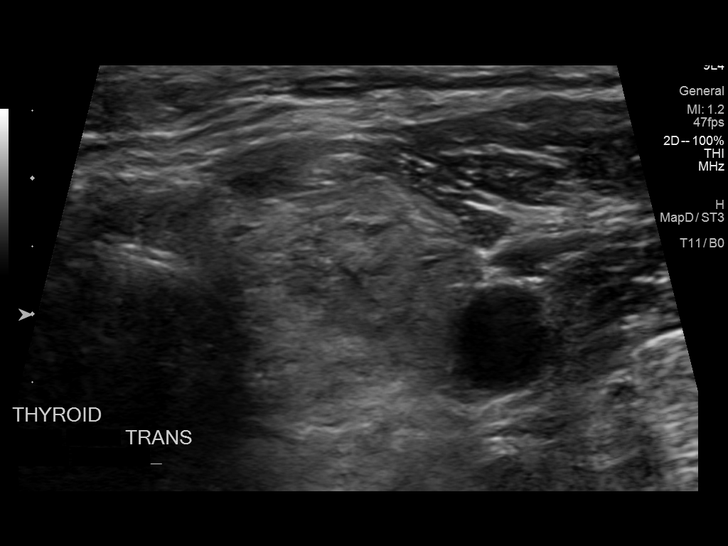
[im 16/18]
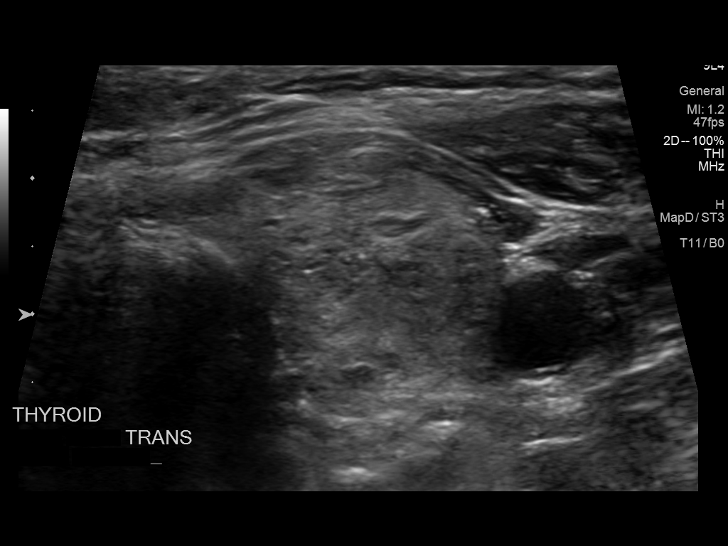
[im 18/18]
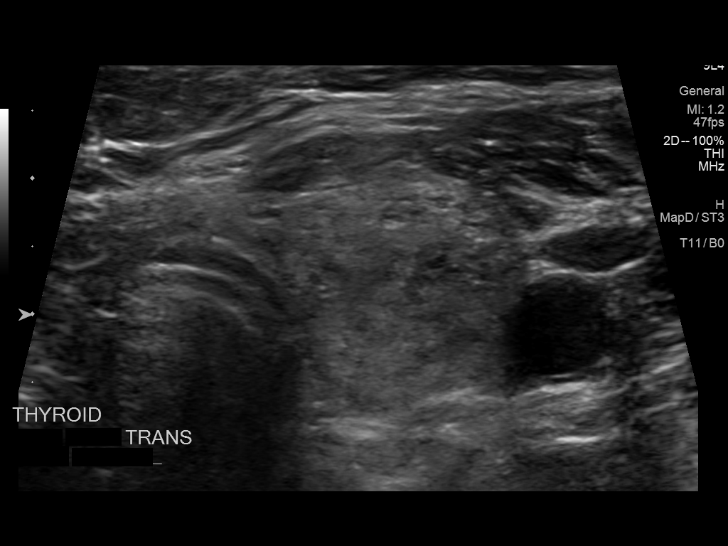

[13 of 18 positions shown; findings below may reference images not displayed]

Pre-procedural ultrasound scanning demonstrated unchanged size and
appearance of the indeterminate nodule within the left inferior
thyroid lobe.

The procedure was planned. The neck was prepped in the usual sterile
fashion, and a sterile drape was applied covering the operative
field. A timeout was performed prior to the initiation of the
procedure. Local anesthesia was provided with 1% lidocaine.

Under direct ultrasound guidance, 5 fine-needle aspirations were
performed with 25 gauge needles. Multiple ultrasound images were
saved for procedural documentation purposes. The samples were
prepared and submitted to pathology.

Limited post procedural scanning was negative for hematoma or
additional complication. Dressings were placed. The patient
tolerated the above procedures procedure well without immediate
postprocedural complication.
FINDINGS: Nodule reference number based on prior diagnostic ultrasound: 2

Maximum size: 2.5 cm

Location: Left; Inferior

ACR TI-RADS risk category: TR3 (3 points)

Reason for biopsy: meets ACR TI-RADS criteria

Ultrasound imaging confirms appropriate placement of the needles
within the thyroid nodule.
IMPRESSION: Technically successful ultrasound guided fine needle aspiration of
left inferior thyroid nodule.

## 2022-07-12 ENCOUNTER — Other Ambulatory Visit: Payer: Self-pay | Admitting: Internal Medicine

## 2022-07-12 DIAGNOSIS — Z1231 Encounter for screening mammogram for malignant neoplasm of breast: Secondary | ICD-10-CM

## 2022-09-10 ENCOUNTER — Ambulatory Visit
Admission: RE | Admit: 2022-09-10 | Discharge: 2022-09-10 | Disposition: A | Discharge status: Home / Self Care | Payer: Medicare Other | Source: Ambulatory Visit | Attending: Internal Medicine | Admitting: Internal Medicine

## 2022-09-10 DIAGNOSIS — Z1231 Encounter for screening mammogram for malignant neoplasm of breast: Secondary | ICD-10-CM

## 2023-08-11 ENCOUNTER — Other Ambulatory Visit: Payer: Self-pay | Admitting: Internal Medicine

## 2023-08-11 DIAGNOSIS — Z1231 Encounter for screening mammogram for malignant neoplasm of breast: Secondary | ICD-10-CM

## 2023-09-12 ENCOUNTER — Ambulatory Visit
Admission: RE | Admit: 2023-09-12 | Discharge: 2023-09-12 | Disposition: A | Discharge status: Home / Self Care | Payer: Medicare Other | Source: Ambulatory Visit | Attending: Internal Medicine | Admitting: Internal Medicine

## 2023-09-12 DIAGNOSIS — Z1231 Encounter for screening mammogram for malignant neoplasm of breast: Secondary | ICD-10-CM

## 2023-11-16 IMAGING — MG MM DIGITAL SCREENING BILAT W/ TOMO AND CAD
8 series · 8 of 24 positions shown · non-contrast
Comparison: Previous exam(s).

CLINICAL DATA: Screening.

EXAM:
DIGITAL SCREENING BILATERAL MAMMOGRAM WITH TOMOSYNTHESIS AND CAD
TECHNIQUE: Bilateral screening digital craniocaudal and mediolateral oblique
mammograms were obtained. Bilateral screening digital breast
tomosynthesis was performed. The images were evaluated with
computer-aided detection.

[R CC synth-2D]
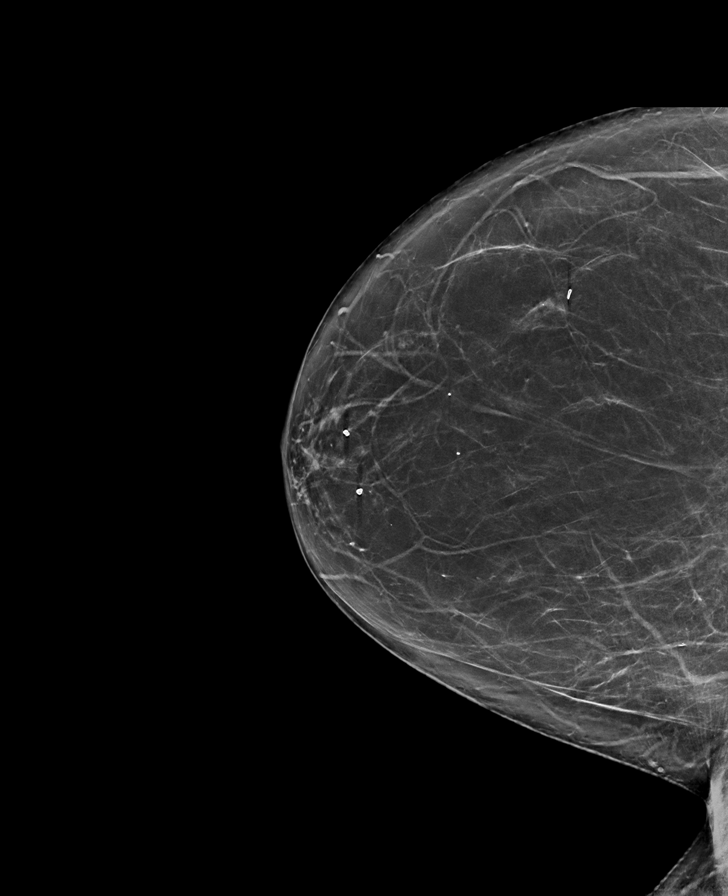

[L MLO synth-2D]
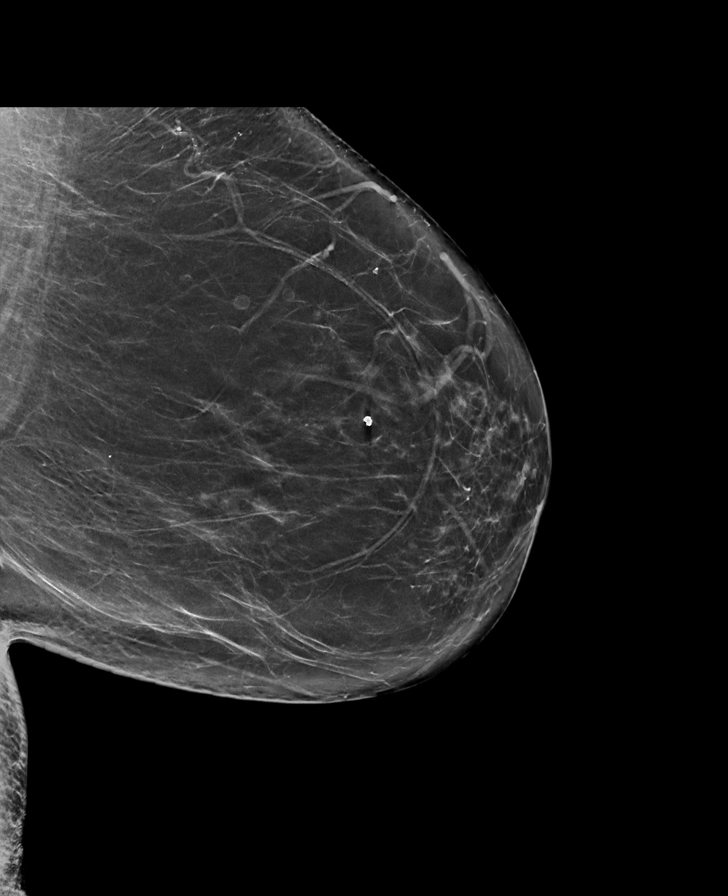

[R MLO synth-2D]
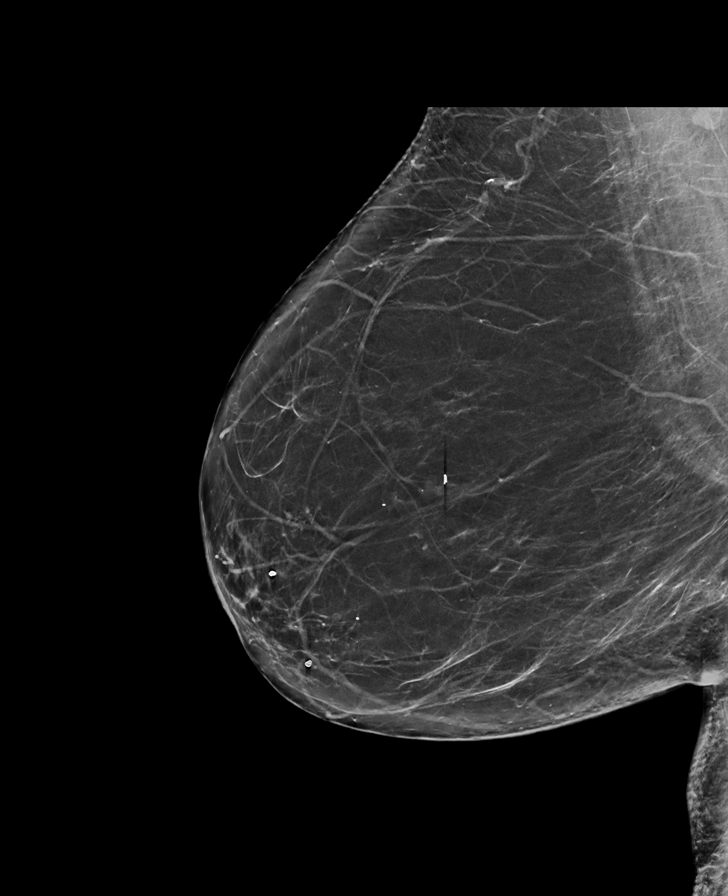

[L CC synth-2D]
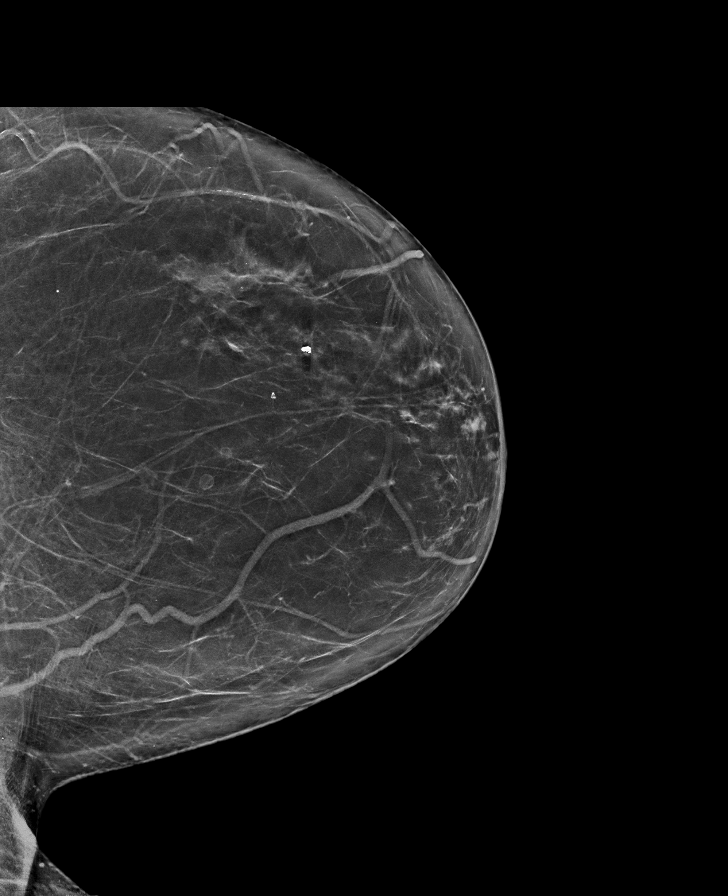

[R CC tomo · tomo slice 39/76.0]
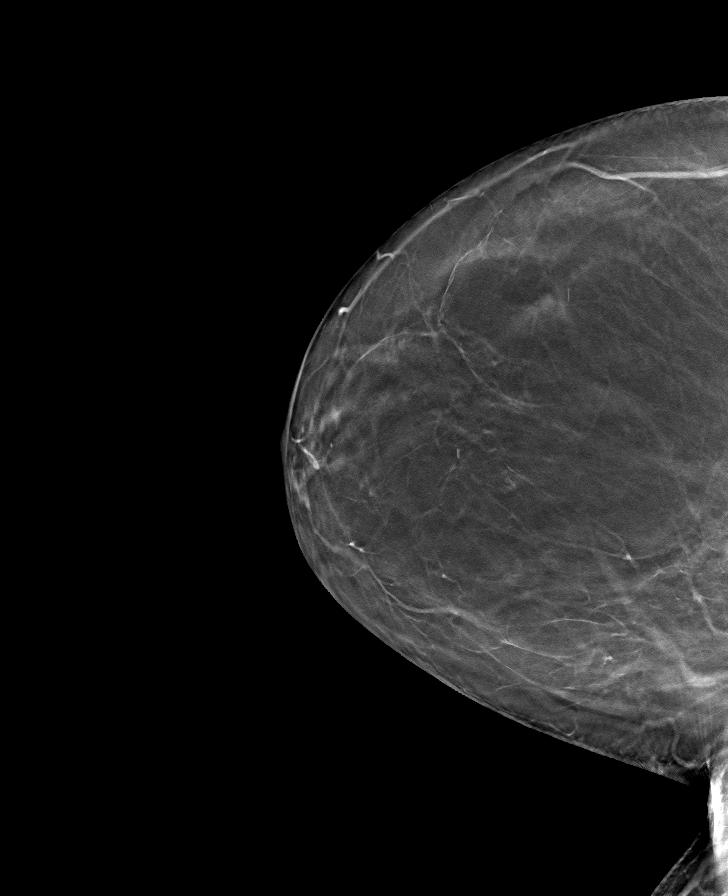

[L MLO tomo · tomo slice 41/81.0]
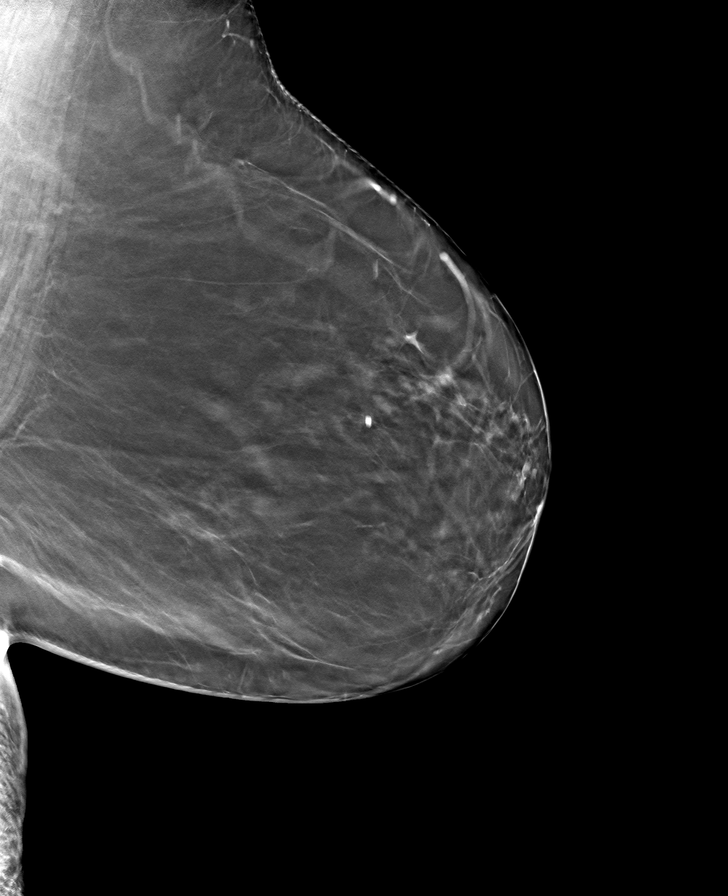

[L CC tomo · tomo slice 39/78.0]
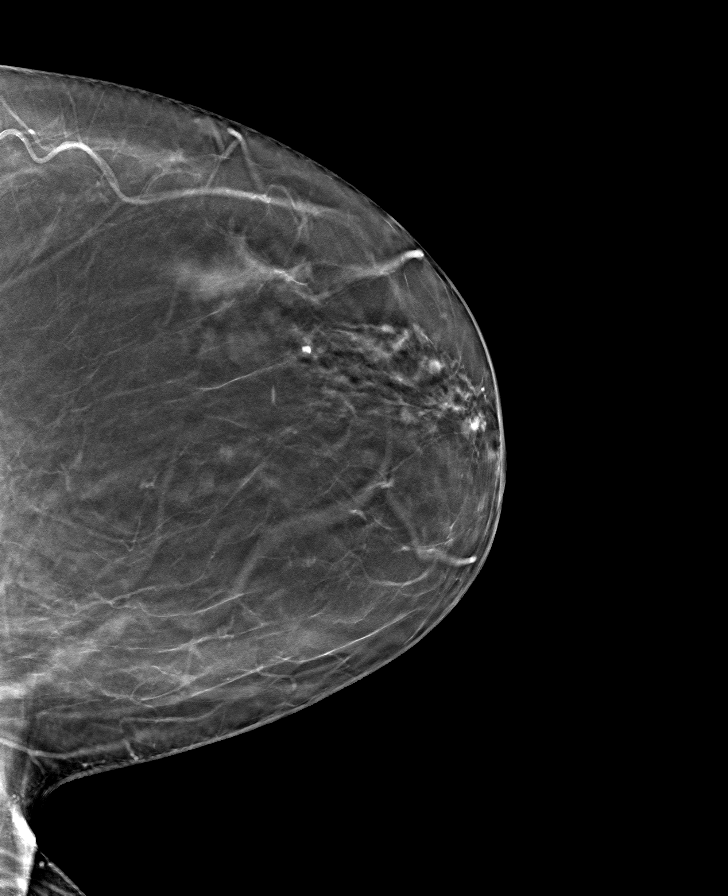

[R MLO tomo · tomo slice 43/84.0]
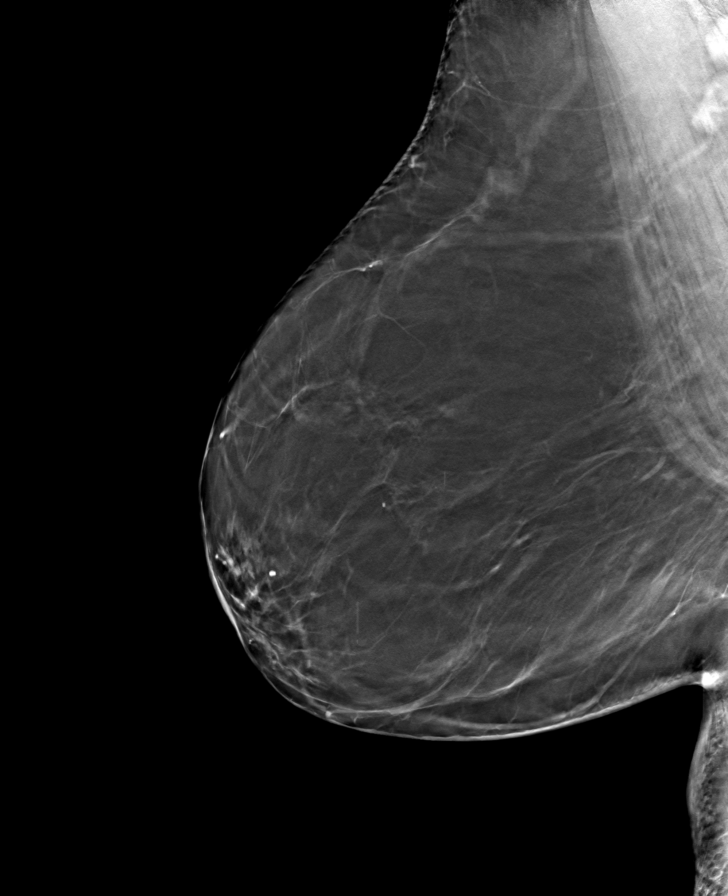

[8 of 24 positions shown; findings below may reference images not displayed]

ACR Breast Density Category b: There are scattered areas of
fibroglandular density.
FINDINGS: There are no findings suspicious for malignancy.
IMPRESSION: No mammographic evidence of malignancy. A result letter of this
screening mammogram will be mailed directly to the patient.

RECOMMENDATION:
Screening mammogram in one year. (Code:51-O-LD2)

BI-RADS CATEGORY  1: Negative.

## 2024-08-09 ENCOUNTER — Other Ambulatory Visit: Payer: Self-pay | Admitting: Internal Medicine

## 2024-08-09 DIAGNOSIS — Z1231 Encounter for screening mammogram for malignant neoplasm of breast: Secondary | ICD-10-CM

## 2024-09-14 ENCOUNTER — Ambulatory Visit

## 2024-09-14 ENCOUNTER — Ambulatory Visit
Admission: RE | Admit: 2024-09-14 | Discharge: 2024-09-14 | Disposition: A | Source: Ambulatory Visit | Attending: Internal Medicine | Admitting: Internal Medicine

## 2024-09-14 DIAGNOSIS — Z1231 Encounter for screening mammogram for malignant neoplasm of breast: Secondary | ICD-10-CM
